# Patient Record
Sex: Female | Born: 1980 | Race: White | Hispanic: No | Smoking: Never smoker
Health system: Southern US, Community
[De-identification: ages and names within clinical notes are randomized; demographics above are authoritative.]

## PROBLEM LIST (undated history)

## (undated) DIAGNOSIS — D649 Anemia, unspecified: Secondary | ICD-10-CM

## (undated) HISTORY — DX: Anemia, unspecified: D64.9

---

## 2014-03-26 ENCOUNTER — Ambulatory Visit (INDEPENDENT_AMBULATORY_CARE_PROVIDER_SITE_OTHER): Payer: BLUE CROSS/BLUE SHIELD | Admitting: Sports Medicine

## 2014-03-26 ENCOUNTER — Encounter: Payer: Self-pay | Admitting: Sports Medicine

## 2014-03-26 VITALS — BP 105/59 | HR 75 | Ht 68.0 in | Wt 125.0 lb

## 2014-03-26 DIAGNOSIS — M25571 Pain in right ankle and joints of right foot: Secondary | ICD-10-CM

## 2014-03-26 MED ORDER — MELOXICAM 15 MG PO TABS: ORAL_TABLET | ORAL | Status: DC

## 2014-03-26 NOTE — Patient Instructions (Signed)
Your main diagnosis today is a Peroneal tendon strain. The following should help improve your symptoms:  1. Meloxicam (an NSAID) 15mg  daily for 7 days, and then take it as needed afterwards. This will help decrease the swelling and inflammation in the area.  2. We provided you with a compression sleeve today, wear this mostly with sports to help prevent swelling as well.  3. We have given you a referral to PT to help provide you with exercises for ankle strengthening.  4. Please follow-up in 4 weeks, at which time we will re-assess our plan and need for any imaging.

## 2014-03-26 NOTE — Progress Notes (Signed)
Subjective:     Patient ID: April Parsons, female   DOB: 12/22/1980, 34 y.o.   MRN: [id number]  HPI  [name] is a 33y.o. Female here today for a 1-2 month history of right ankle pain and swelling. She reports that the pain is worse behind her lateral malleolus and reports swelling and ecchymosis along that area. Pt reports tearing all of her lateral ankle ligaments while playing basketball at age 34, for which she was placed in a cast for several months. Since that injury, pt reports mild discomfort and instability in her right ankle, and thus was concerned with the increased pain and swelling she began to experience 2 months ago. Pt denies any injuries or excessive use that could have triggered this pain, but has taken[employer] for the past year, and recalls that pain began after she took a 2 week break from the class. She reports increased pain and swelling 3 days ago, after running in the yard with her niece. She also reports that unlike before, she has swelling and discomfort in the morning and after walking for long periods of time. Pt reports trying ibuprofen with the pain occasionally, which provides some relief. Patient reports that all of her swelling and pain are along the posterior aspect of the lateral malleolus along the course of the peroneal tendon with some pain extending down into the foot as well.  Review of Systems     Objective:   Physical Exam Filed Vitals:   03/26/14 0856  BP: 105/59  Pulse: 75  General: Pleasant female in NAD  Right Ankle: Observation: No edema, no erythema or bruising, symmetric in size with left ankle                     Palpation: No tenderness to palpation over lateral or medial malleolus, no soft tissue swelling, no effusion, no defects                     ROM: normal plantar flexion and dorsiflexion, normal pronation, normal supination. Patient has reproducible pain with stretching of the peroneal tendon but good strength.         Strength: Normal 5/5 strength with plantar flexion and dorsiflexion, pronation and supination                     Special testing: Anterior drawer test: positive on the right                                               Talar tilt test: increased laxity on R>L                   Assessment:     [name] is a 33y.o. Female here today for a 1-2 month history of right ankle pain and swelling most consistent with Peroneal tendon strain.    Plan:     1. Meloxicam 15mg  daily for 7 days, and then take it as needed afterwards. This will help decrease the swelling and inflammation in the area.  2. We provided you with a compression sleeve today, wear this mostly with sports to help prevent swelling as well.  3. We have given you a referral to PT to help provide you with home exercises for ankle strengthening.  4. Please follow-up in 4  weeks. If symptoms persist consider ultrasound evaluation to confirm diagnosis and rule out peroneal tendon tear.

## 2014-04-23 ENCOUNTER — Ambulatory Visit: Payer: BLUE CROSS/BLUE SHIELD | Admitting: Sports Medicine

## 2015-03-08 ENCOUNTER — Ambulatory Visit (INDEPENDENT_AMBULATORY_CARE_PROVIDER_SITE_OTHER): Payer: BLUE CROSS/BLUE SHIELD | Admitting: Physician Assistant

## 2015-03-08 VITALS — BP 104/60 | HR 94 | Temp 99.0°F | Resp 16 | Ht 67.0 in | Wt 132.0 lb

## 2015-03-08 DIAGNOSIS — J111 Influenza due to unidentified influenza virus with other respiratory manifestations: Secondary | ICD-10-CM

## 2015-03-08 DIAGNOSIS — R69 Illness, unspecified: Principal | ICD-10-CM

## 2015-03-08 DIAGNOSIS — R070 Pain in throat: Secondary | ICD-10-CM | POA: Diagnosis not present

## 2015-03-08 DIAGNOSIS — R05 Cough: Secondary | ICD-10-CM

## 2015-03-08 DIAGNOSIS — R5383 Other fatigue: Secondary | ICD-10-CM

## 2015-03-08 LAB — POCT INFLUENZA A/B
INFLUENZA A, POC: NEGATIVE
Influenza B, POC: NEGATIVE

## 2015-03-08 MED ORDER — HYDROCODONE-HOMATROPINE 5-1.5 MG/5ML PO SYRP: 2.5000 mL | ORAL_SOLUTION | Freq: Every evening | ORAL | Status: DC | PRN

## 2015-03-08 MED ORDER — OSELTAMIVIR PHOSPHATE 75 MG PO CAPS: 75.0000 mg | ORAL_CAPSULE | Freq: Two times a day (BID) | ORAL | Status: DC

## 2015-03-08 NOTE — Progress Notes (Signed)
   03/08/2015 9:43 AM   DOB: 09-13-1980 / MRN: [id number]  SUBJECTIVE:  April Parsons is a 35 y.o. female presenting for for the evaluation of fever, sore throat, headache and myagia that started 1 days ago.  She denies difficulty breathing and jaw pain. Treatments tried thus far include Nyquil with fair  relief. She reports sick contacts.  She is allergic to yeast-related products.   She  has a past medical history of Anemia.    She  reports that she has never smoked. She does not have any smokeless tobacco history on file. She  has no sexual activity history on file. The patient  has no past surgical history on file.  Her family history is not on file.  Review of Systems  Constitutional: Positive for malaise/fatigue. Negative for fever, chills and diaphoresis.  HENT: Positive for congestion and sore throat.   Respiratory: Positive for cough. Negative for hemoptysis, shortness of breath and wheezing.   Cardiovascular: Negative for chest pain.  Gastrointestinal: Negative for nausea.  Musculoskeletal: Positive for myalgias.  Skin: Negative for rash.  Neurological: Negative for dizziness and weakness.  Endo/Heme/Allergies: Negative for polydipsia.    Problem list and medications reviewed and updated by myself where necessary, and exist elsewhere in the encounter.   OBJECTIVE:  BP 104/60 mmHg  Pulse 94  Temp(Src) 99 F (37.2 C) (Oral)  Resp 16  Ht  (1.702 m)  Wt 132 lb (59.875 kg)  BMI 20.67 kg/m2  SpO2 98%  LMP 02/05/2015  Physical Exam  Constitutional: She is oriented to person, place, and time. She appears well-nourished. No distress.  Eyes: EOM are normal. Pupils are equal, round, and reactive to light.  Cardiovascular: Normal rate.   Pulmonary/Chest: Effort normal.  Abdominal: She exhibits no distension.  Neurological: She is alert and oriented to person, place, and time. No cranial nerve deficit. Gait normal.  Skin: Skin is dry. She is not diaphoretic.    Psychiatric: She has a normal mood and affect.  Vitals reviewed.   No results found for this or any previous visit (from the past 48 hour(s)).  ASSESSMENT AND PLAN:  Darthula was seen today for generalized body aches, sinusitis and sore throat.  Diagnoses and all orders for this visit:  Influenza-like illness: Flu test negative however the way she is describing her symptoms point towards influenza.  Will treat.  Work note provided.  -     POCT Influenza A/B -     oseltamivir (TAMIFLU) 75 MG capsule; Take 1 capsule (75 mg total) by mouth 2 (two) times daily. -     HYDROcodone-homatropine (HYCODAN) 5-1.5 MG/5ML syrup; Take 2.5-5 mLs by mouth at bedtime as needed.    The patient was advised to call or return to clinic if she does not see an improvement in symptoms or to seek the care of the closest emergency department if she worsens with the above plan.   Deliah Boston, MHS, PA-C [facility] 03/08/2015 9:43 AM

## 2015-03-08 NOTE — Patient Instructions (Signed)
 -   You would benefit from high dose ibuprofen. TAKE 600-800 mg of ibuprofen every 8 hours as needed to control low grade fever, fatigue, and pain.    - I also recommend that you take Zyrtec-D 5-120 every morning or Claritin-D 10-240 for the next five to 10 days. This medication will help you with nasal congestion, post nasal drip and sneezing. You will have to purchase this directly from the pharmacist

## 2015-05-15 ENCOUNTER — Ambulatory Visit (INDEPENDENT_AMBULATORY_CARE_PROVIDER_SITE_OTHER): Payer: BLUE CROSS/BLUE SHIELD | Admitting: Physician Assistant

## 2015-05-15 VITALS — BP 102/62 | HR 81 | Temp 98.3°F | Resp 18 | Ht 67.0 in | Wt 132.0 lb

## 2015-05-15 DIAGNOSIS — R21 Rash and other nonspecific skin eruption: Secondary | ICD-10-CM | POA: Diagnosis not present

## 2015-05-15 DIAGNOSIS — L282 Other prurigo: Secondary | ICD-10-CM | POA: Diagnosis not present

## 2015-05-15 MED ORDER — BETAMETHASONE DIPROPIONATE 0.05 % EX CREA: TOPICAL_CREAM | Freq: Two times a day (BID) | CUTANEOUS | Status: DC

## 2015-05-15 MED ORDER — PREDNISONE 20 MG PO TABS: 40.0000 mg | ORAL_TABLET | Freq: Every day | ORAL | Status: DC

## 2015-05-15 NOTE — Patient Instructions (Signed)
     IF you received an x-ray today, you will receive an invoice from [facility]. Please contact [facility] at [phone] with questions or concerns regarding your invoice.   IF you received labwork today, you will receive an invoice from [facility]. Please contact [facility] at [phone] with questions or concerns regarding your invoice.   Our billing staff will not be able to assist you with questions regarding bills from these companies.  You will be contacted with the lab results as soon as they are available. The fastest way to get your results is to activate your My Chart account. Instructions are located on the last page of this paperwork. If you have not heard from us regarding the results in 2 weeks, please contact this office.      

## 2015-05-15 NOTE — Progress Notes (Signed)
   05/15/2015 2:49 PM   DOB: 03-01-1980 / MRN: [id number]  SUBJECTIVE:  April Parsons is a 35 y.o. female presenting for a rash on the back of her neck that began after she rubbed copious muscle rub to the area three days ago.  She is complaining of primarily itching at this time. Reports the itching is somewhat improved but has been largely intractable to po antihistamines, topical steroid OTC preps, and benadryl cream.    She is allergic to yeast-related products.   She  has a past medical history of Anemia.    She  reports that she has never smoked. She does not have any smokeless tobacco history on file. She  has no sexual activity history on file. The patient  has no past surgical history on file.  Her family history is not on file.  Review of Systems  Constitutional: Negative for fever and chills.  Cardiovascular: Negative for chest pain.  Skin: Positive for itching and rash.  Neurological: Negative for dizziness and headaches.    Problem list and medications reviewed and updated by myself where necessary, and exist elsewhere in the encounter.   OBJECTIVE:  BP 102/62 mmHg  Pulse 81  Temp(Src) 98.3 F (36.8 C) (Oral)  Resp 18  Ht 5\' 7"  (1.702 m)  Wt 132 lb (59.875 kg)  BMI 20.67 kg/m2  SpO2 99%  LMP 05/13/2015  Physical Exam  Constitutional: She is oriented to person, place, and time. She appears well-nourished. No distress.  Eyes: EOM are normal. Pupils are equal, round, and reactive to light.  Cardiovascular: Normal rate.   Pulmonary/Chest: Effort normal.  Abdominal: She exhibits no distension.  Musculoskeletal:       Back:  Neurological: She is alert and oriented to person, place, and time. No cranial nerve deficit. Gait normal.  Skin: Skin is dry. She is not diaphoretic.  Psychiatric: She has a normal mood and affect.  Vitals reviewed.   No results found for this or any previous visit (from the past 72 hour(s)).  No results found.  ASSESSMENT AND  PLAN  [name] was seen today for neck problem.  Diagnoses and all orders for this visit:  Rash and nonspecific skin eruption: This is probably a reaction to excess capsaicin. She wants po steroids. Will fill given that she is in agony, however I have advised that she give the betamethasone 24 hours before filling.  I have advised that she avoid any other creams at this time.  She voiced understanding of the plan and will try to avoid to po steroids.  -     betamethasone dipropionate (DIPROLENE) 0.05 % cream; Apply topically 2 (two) times daily.  Pruritic rash  Other orders -     predniSONE (DELTASONE) 20 MG tablet; Take 2 tablets (40 mg total) by mouth daily with breakfast. Please give the cream 24 hours before filling this medication.    The patient was advised to call or return to clinic if she does not see an improvement in symptoms or to seek the care of the closest emergency department if she worsens with the above plan.   Deliah BostonMichael Naimah Yingst, MHS, PA-C [facility] 05/15/2015 2:49 PM

## 2016-08-23 ENCOUNTER — Encounter: Payer: Self-pay | Admitting: Physician Assistant

## 2016-08-23 ENCOUNTER — Ambulatory Visit (INDEPENDENT_AMBULATORY_CARE_PROVIDER_SITE_OTHER): Payer: Self-pay | Admitting: Physician Assistant

## 2016-08-23 VITALS — BP 97/65 | HR 88 | Temp 97.9°F | Resp 18 | Ht 67.0 in | Wt 127.0 lb

## 2016-08-23 DIAGNOSIS — R5383 Other fatigue: Secondary | ICD-10-CM

## 2016-08-23 DIAGNOSIS — J029 Acute pharyngitis, unspecified: Secondary | ICD-10-CM

## 2016-08-23 DIAGNOSIS — R591 Generalized enlarged lymph nodes: Secondary | ICD-10-CM

## 2016-08-23 LAB — POCT RAPID STREP A (OFFICE): Rapid Strep A Screen: NEGATIVE

## 2016-08-23 MED ORDER — IBUPROFEN 600 MG PO TABS: 600.0000 mg | ORAL_TABLET | Freq: Three times a day (TID) | ORAL | 0 refills | Status: DC | PRN

## 2016-08-23 MED ORDER — LIDOCAINE VISCOUS 2 % MT SOLN: 5.0000 mL | OROMUCOSAL | 1 refills | Status: DC | PRN

## 2016-08-23 MED ORDER — AMOXICILLIN 875 MG PO TABS: 875.0000 mg | ORAL_TABLET | Freq: Two times a day (BID) | ORAL | 0 refills | Status: AC

## 2016-08-23 NOTE — Progress Notes (Signed)
08/23/2016 6:16 PM   DOB: 1980/04/06 / MRN: [id number]  SUBJECTIVE:  April Parsons is a 36 y.o. female presenting for sore throat and fatigue. States that the sore throat has been on and off for about 20 or so days now. Associates fatigue.  Denies sneezing and sinus drainage, eye and ear itching. Denies GERD.  Feels that she is getting worse.   Has been taking cough drops for throat pain. She is self pay.   She is allergic to yeast-related products.   She  has a past medical history of Anemia.    She  reports that she has never smoked. She has never used smokeless tobacco. She  has no sexual activity history on file. The patient  has no past surgical history on file.  Her family history is not on file.  Review of Systems  Constitutional: Negative for chills and fever.  Skin: Negative for itching and rash.    The problem list and medications were reviewed and updated by myself where necessary and exist elsewhere in the encounter.   OBJECTIVE:  BP 97/65 (BP Location: Right Arm, Patient Position: Sitting, Cuff Size: Normal)   Pulse 88   Temp 97.9 F (36.6 C) (Oral)   Resp 18   Ht 5\' 7"  (1.702 m)   Wt 127 lb (57.6 kg)   LMP 07/18/2016   SpO2 97%   BMI 19.89 kg/m   BP Readings from Last 3 Encounters:  08/23/16 97/65  05/15/15 102/62  03/08/15 104/60   Physical Exam  Constitutional: She is active.  Non-toxic appearance.  HENT:  Right Ear: Hearing, tympanic membrane, external ear and ear canal normal.  Left Ear: Hearing, tympanic membrane, external ear and ear canal normal.  Nose: Nose normal. Right sinus exhibits no maxillary sinus tenderness and no frontal sinus tenderness. Left sinus exhibits no maxillary sinus tenderness and no frontal sinus tenderness.  Mouth/Throat: Uvula is midline, oropharynx is clear and moist and mucous membranes are normal. Mucous membranes are not dry. No oropharyngeal exudate, posterior oropharyngeal edema or tonsillar abscesses.    Cardiovascular: Normal rate, regular rhythm, S1 normal, S2 normal, normal heart sounds and intact distal pulses.  Exam reveals no gallop, no friction rub and no decreased pulses.   No murmur heard. Pulmonary/Chest: Effort normal. No stridor. No tachypnea. No respiratory distress. She has no wheezes. She has no rales.  Abdominal: Soft. Bowel sounds are normal. She exhibits no distension and no mass. There is no tenderness. There is no rebound and no guarding.  Musculoskeletal: She exhibits no edema.  Lymphadenopathy:       Head (right side): Submandibular adenopathy present. No tonsillar adenopathy present.       Head (left side): Submandibular adenopathy present. No tonsillar adenopathy present.    She has no cervical adenopathy.  Neurological: She is alert.  Skin: Skin is warm and dry. She is not diaphoretic. No pallor.    Results for orders placed or performed in visit on 08/23/16 (from the past 72 hour(s))  POCT rapid strep A     Status: None   Collection Time: 08/23/16 11:57 AM  Result Value Ref Range   Rapid Strep A Screen Negative Negative    No results found.  ASSESSMENT AND PLAN:  April Parsons was seen today for sore throat.  Diagnoses and all orders for this visit:  Sore throat: No sign of allergies on exam.  No reports of GERD. Holding culture as she is self pay.  Mono unlikely given her  age but will check this. Symptomatic relief and amox for now.  -     Cancel: Culture, Group A Strep -     POCT rapid strep A -     ibuprofen (ADVIL,MOTRIN) 600 MG tablet; Take 1 tablet (600 mg total) by mouth every 8 (eight) hours as needed. -     lidocaine (XYLOCAINE) 2 % solution; Use as directed 5-10 mLs in the mouth or throat as needed for mouth pain.  Fatigue, unspecified type -     CBC with Differential/Platelet -     Epstein-Barr virus VCA, IgM -     TSH  Lymphadenopathy of head and neck -     amoxicillin (AMOXIL) 875 MG tablet; Take 1 tablet (875 mg total) by mouth 2 (two) times  daily.    The patient is advised to call or return to clinic if she does not see an improvement in symptoms, or to seek the care of the closest emergency department if she worsens with the above plan.   Deliah Boston, MHS, PA-C [facility] 08/23/2016 6:16 PM

## 2016-08-23 NOTE — Patient Instructions (Signed)
     IF you received an x-ray today, you will receive an invoice from [facility]. Please contact [facility] at [phone] with questions or concerns regarding your invoice.   IF you received labwork today, you will receive an invoice from [facility]. Please contact [facility] at [phone] with questions or concerns regarding your invoice.   Our billing staff will not be able to assist you with questions regarding bills from these companies.  You will be contacted with the lab results as soon as they are available. The fastest way to get your results is to activate your My Chart account. Instructions are located on the last page of this paperwork. If you have not heard from us regarding the results in 2 weeks, please contact this office.     

## 2016-08-24 LAB — CBC WITH DIFFERENTIAL/PLATELET
BASOS ABS: 0 10*3/uL (ref 0.0–0.2)
Basos: 0 %
EOS (ABSOLUTE): 0.2 10*3/uL (ref 0.0–0.4)
Eos: 2 %
HEMATOCRIT: 37.5 % (ref 34.0–46.6)
Hemoglobin: 12.6 g/dL (ref 11.1–15.9)
Immature Grans (Abs): 0 10*3/uL (ref 0.0–0.1)
Immature Granulocytes: 0 %
LYMPHS ABS: 1.7 10*3/uL (ref 0.7–3.1)
Lymphs: 25 %
MCH: 31.5 pg (ref 26.6–33.0)
MCHC: 33.6 g/dL (ref 31.5–35.7)
MCV: 94 fL (ref 79–97)
MONOCYTES: 8 %
MONOS ABS: 0.5 10*3/uL (ref 0.1–0.9)
Neutrophils Absolute: 4.3 10*3/uL (ref 1.4–7.0)
Neutrophils: 65 %
Platelets: 253 10*3/uL (ref 150–379)
RBC: 4 x10E6/uL (ref 3.77–5.28)
RDW: 12.9 % (ref 12.3–15.4)
WBC: 6.7 10*3/uL (ref 3.4–10.8)

## 2016-08-24 LAB — TSH: TSH: 1.09 u[IU]/mL (ref 0.450–4.500)

## 2016-08-24 LAB — EPSTEIN-BARR VIRUS VCA, IGM

## 2016-08-25 LAB — CULTURE, GROUP A STREP: Strep A Culture: NEGATIVE

## 2016-09-01 ENCOUNTER — Telehealth: Payer: Self-pay

## 2016-09-01 NOTE — Telephone Encounter (Signed)
Call placed to patient as requested, no answer on patient phone. Left message for patient to return call to this office./ S.Annemarie Sebree,CMA 

## 2016-09-01 NOTE — Telephone Encounter (Signed)
-----   Message from Ofilia NeasMichael L Clark, PA-C sent at 08/30/2016  2:54 PM EDT ----- Molli Knockkay to stop abx as the work up is completely negative. If remains symptomatic this trying a Zyrtec in the morning and a ranitidine 150 mg at night for a week would be advisable before coming back in. Deliah BostonMichael Clark, MS, PA-C 2:54 PM, 08/30/2016

## 2017-02-23 ENCOUNTER — Encounter: Payer: Self-pay | Admitting: Physician Assistant

## 2017-02-23 ENCOUNTER — Ambulatory Visit: Payer: 59 | Admitting: Physician Assistant

## 2017-02-23 ENCOUNTER — Other Ambulatory Visit: Payer: Self-pay

## 2017-02-23 VITALS — BP 99/64 | HR 81 | Temp 98.4°F | Resp 18 | Ht 67.44 in | Wt 128.0 lb

## 2017-02-23 DIAGNOSIS — R1084 Generalized abdominal pain: Secondary | ICD-10-CM | POA: Diagnosis not present

## 2017-02-23 DIAGNOSIS — N926 Irregular menstruation, unspecified: Secondary | ICD-10-CM

## 2017-02-23 LAB — POCT URINALYSIS DIP (MANUAL ENTRY)
BILIRUBIN UA: NEGATIVE
GLUCOSE UA: NEGATIVE mg/dL
Ketones, POC UA: NEGATIVE mg/dL
Leukocytes, UA: NEGATIVE
NITRITE UA: NEGATIVE
SPEC GRAV UA: 1.015 (ref 1.010–1.025)
Urobilinogen, UA: 1 E.U./dL
pH, UA: 8.5 — AB (ref 5.0–8.0)

## 2017-02-23 LAB — POC MICROSCOPIC URINALYSIS (UMFC): Mucus: ABSENT

## 2017-02-23 LAB — POCT URINE PREGNANCY: PREG TEST UR: NEGATIVE

## 2017-02-23 MED ORDER — HYOSCYAMINE SULFATE SL 0.125 MG SL SUBL: 1.0000 | SUBLINGUAL_TABLET | Freq: Four times a day (QID) | SUBLINGUAL | 0 refills | Status: DC

## 2017-02-23 MED ORDER — ONDANSETRON 8 MG PO TBDP: 8.0000 mg | ORAL_TABLET | Freq: Three times a day (TID) | ORAL | 0 refills | Status: DC | PRN

## 2017-02-23 NOTE — Patient Instructions (Addendum)
Please hydrate well with 64 oz of water if not more. You can take ibuprofen or tylenol for pain and fever. Push hydration.  You can use ORS in water to replenish electrolytes.   Please take the anti-emetic and the levsin is for abdominal cramping.  Viral Gastroenteritis, Adult Viral gastroenteritis is also known as the stomach flu. This condition is caused by certain germs (viruses). These germs can be passed from person to person very easily (are very contagious). This condition can cause sudden watery poop (diarrhea), fever, and throwing up (vomiting). Having watery poop and throwing up can make you feel weak and cause you to get dehydrated. Dehydration can make you tired and thirsty, make you have a dry mouth, and make it so you pee (urinate) less often. Older adults and people with other diseases or a weak defense system (immune system) are at higher risk for dehydration. It is important to replace the fluids that you lose from having watery poop and throwing up. Follow these instructions at home: Follow instructions from your doctor about how to care for yourself at home. Eating and drinking  Follow these instructions as told by your doctor:  Take an oral rehydration solution (ORS). This is a drink that is sold at pharmacies and stores.  Drink clear fluids in small amounts as you are able, such as: ? Water. ? Ice chips. ? Diluted fruit juice. ? Low-calorie sports drinks.  Eat bland, easy-to-digest foods in small amounts as you are able, such as: ? Bananas. ? Applesauce. ? Rice. ? Low-fat (lean) meats. ? Toast. ? Crackers.  Avoid fluids that have a lot of sugar or caffeine in them.  Avoid alcohol.  Avoid spicy or fatty foods.  General instructions  Drink enough fluid to keep your pee (urine) clear or pale yellow.  Wash your hands often. If you cannot use soap and water, use hand sanitizer.  Make sure that all people in your home wash their hands well and often.  Rest at  home while you get better.  Take over-the-counter and prescription medicines only as told by your doctor.  Watch your condition for any changes.  Take a warm bath to help with any burning or pain from having watery poop.  Keep all follow-up visits as told by your doctor. This is important. Contact a doctor if:  You cannot keep fluids down.  Your symptoms get worse.  You have new symptoms.  You feel light-headed or dizzy.  You have muscle cramps. Get help right away if:  You have chest pain.  You feel very weak or you pass out (faint).  You see blood in your throw-up.  Your throw-up looks like coffee grounds.  You have bloody or black poop (stools) or poop that look like tar.  You have a very bad headache, a stiff neck, or both.  You have a rash.  You have very bad pain, cramping, or bloating in your belly (abdomen).  You have trouble breathing.  You are breathing very quickly.  Your heart is beating very quickly.  Your skin feels cold and clammy.  You feel confused.  You have pain when you pee.  You have signs of dehydration, such as: ? Dark pee, hardly any pee, or no pee. ? Cracked lips. ? Dry mouth. ? Sunken eyes. ? Sleepiness. ? Weakness. This information is not intended to replace advice given to you by your health care provider. Make sure you discuss any questions you have with your health care  provider. Document Released: 06/23/2007 Document Revised: 07/25/2015 Document Reviewed: 09/10/2014 Elsevier Interactive Patient Education  2017 [employer].  Food Choices to Help Relieve Diarrhea, Adult When you have diarrhea, the foods you eat and your eating habits are very important. Choosing the right foods and drinks can help:  Relieve diarrhea.  Replace lost fluids and nutrients.  Prevent dehydration.  What general guidelines should I follow? Relieving diarrhea  Choose foods with less than 2 g or .07 oz. of fiber per serving.  Limit fats  to less than 8 tsp (38 g or 1.34 oz.) a day.  Avoid the following: ? Foods and beverages sweetened with high-fructose corn syrup, honey, or sugar alcohols such as xylitol, sorbitol, and mannitol. ? Foods that contain a lot of fat or sugar. ? Fried, greasy, or spicy foods. ? High-fiber grains, breads, and cereals. ? Raw fruits and vegetables.  Eat foods that are rich in probiotics. These foods include dairy products such as yogurt and fermented milk products. They help increase healthy bacteria in the stomach and intestines (gastrointestinal tract, or GI tract).  If you have lactose intolerance, avoid dairy products. These may make your diarrhea worse.  Take medicine to help stop diarrhea (antidiarrheal medicine) only as told by your health care provider. Replacing nutrients  Eat small meals or snacks every 3-4 hours.  Eat bland foods, such as white rice, toast, or baked potato, until your diarrhea starts to get better. Gradually reintroduce nutrient-rich foods as tolerated or as told by your health care provider. This includes: ? Well-cooked protein foods. ? Peeled, seeded, and soft-cooked fruits and vegetables. ? Low-fat dairy products.  Take vitamin and mineral supplements as told by your health care provider. Preventing dehydration   Start by sipping water or a special solution to prevent dehydration (oral rehydration solution, ORS). Urine that is clear or pale yellow means that you are getting enough fluid.  Try to drink at least 8-10 cups of fluid each day to help replace lost fluids.  You may add other liquids in addition to water, such as clear juice or decaffeinated sports drinks, as tolerated or as told by your health care provider.  Avoid drinks with caffeine, such as coffee, tea, or soft drinks.  Avoid alcohol. What foods are recommended? The items listed may not be a complete list. Talk with your health care provider about what dietary choices are best for  you. Grains White rice. White, [place], or pita breads (fresh or toasted), including plain rolls, buns, or bagels. White pasta. Saltine, soda, or graham crackers. Pretzels. Low-fiber cereal. Cooked cereals made with water (such as cornmeal, farina, or cream cereals). Plain muffins. Matzo. Melba toast. Zwieback. Vegetables Potatoes (without the skin). Most well-cooked and canned vegetables without skins or seeds. Tender lettuce. Fruits Apple sauce. Fruits canned in juice. Cooked apricots, cherries, grapefruit, peaches, pears, or plums. Fresh bananas and cantaloupe. Meats and other protein foods Baked or boiled chicken. Eggs. Tofu. Fish. Seafood. Smooth nut butters. Ground or well-cooked tender beef, ham, veal, lamb, pork, or poultry. Dairy Plain yogurt, kefir, and unsweetened liquid yogurt. Lactose-free milk, buttermilk, skim milk, or soy milk. Low-fat or nonfat hard cheese. Beverages Water. Low-calorie sports drinks. Fruit juices without pulp. Strained tomato and vegetable juices. Decaffeinated teas. Sugar-free beverages not sweetened with sugar alcohols. Oral rehydration solutions, if approved by your health care provider. Seasoning and other foods Bouillon, broth, or soups made from recommended foods. What foods are not recommended? The items listed may not be a complete  list. Talk with your health care provider about what dietary choices are best for you. Grains Whole grain, whole wheat, bran, or rye breads, rolls, pastas, and crackers. Wild or brown rice. Whole grain or bran cereals. Barley. Oats and oatmeal. Corn tortillas or taco shells. Granola. Popcorn. Vegetables Raw vegetables. Fried vegetables. Cabbage, broccoli, Brussels sprouts, artichokes, baked beans, beet greens, corn, kale, legumes, peas, sweet potatoes, and yams. Potato skins. Cooked spinach and cabbage. Fruits Dried fruit, including raisins and dates. Raw fruits. Stewed or dried prunes. Canned fruits with syrup. Meat and  other protein foods Fried or fatty meats. Deli meats. Chunky nut butters. Nuts and seeds. Beans and lentils. Tomasa Blase. Hot dogs. Sausage. Dairy High-fat cheeses. Whole milk, chocolate milk, and beverages made with milk, such as milk shakes. Half-and-half. Cream. sour cream. Ice cream. Beverages Caffeinated beverages (such as coffee, tea, soda, or energy drinks). Alcoholic beverages. Fruit juices with pulp. Prune juice. Soft drinks sweetened with high-fructose corn syrup or sugar alcohols. High-calorie sports drinks. Fats and oils Butter. Cream sauces. Margarine. Salad oils. Plain salad dressings. Olives. Avocados. Mayonnaise. Sweets and desserts Sweet rolls, doughnuts, and sweet breads. Sugar-free desserts sweetened with sugar alcohols such as xylitol and sorbitol. Seasoning and other foods Honey. Hot sauce. Chili powder. Gravy. Cream-based or milk-based soups. Pancakes and waffles. Summary  When you have diarrhea, the foods you eat and your eating habits are very important.  Make sure you get at least 8-10 cups of fluid each day, or enough to keep your urine clear or pale yellow.  Eat bland foods and gradually reintroduce healthy, nutrient-rich foods as tolerated, or as told by your health care provider.  Avoid high-fiber, fried, greasy, or spicy foods. This information is not intended to replace advice given to you by your health care provider. Make sure you discuss any questions you have with your health care provider. Document Released: 03/27/2003 Document Revised: 01/02/2016 Document Reviewed: 01/02/2016 Elsevier Interactive Patient Education  2018 [employer].    IF you received an x-ray today, you will receive an invoice from [facility]. Please contact [facility] at [phone] with questions or concerns regarding your invoice.   IF you received labwork today, you will receive an invoice from [facility]. Please contact [facility] at [phone] with questions or  concerns regarding your invoice.   Our billing staff will not be able to assist you with questions regarding bills from these companies.  You will be contacted with the lab results as soon as they are available. The fastest way to get your results is to activate your My Chart account. Instructions are located on the last page of this paperwork. If you have not heard from [place] regarding the results in 2 weeks, please contact this office.

## 2017-02-23 NOTE — Progress Notes (Signed)
[facility] [address], [place] [phone]  Date:  02/23/2017   Name:  April Parsons   DOB:  12-22-1980   MRN:  [id number]  PCP:  Patient, No Pcp Per    History of Present Illness:  April Parsons is a 37 y.o. female patient who presents to PCP with  Chief Complaint  Patient presents with  . Dizziness    X 2 days- pt states with headache  . Emesis    X 3 days with some diarrhea and hot and cold- pt thinks food poisoning      4 days ago onset of emesis and diarrhea.  The next day, she had diarrhea and emesis.  No fever or chills.  She has headache, and fatigue.  She had 1 bottle of pedialyte.  She has some nausea.  Last episode of emesis was 2 days ago.  Last episode 2 days ago.  Stool was black, but had some pepto bismol.   No coughing congestion, sore throat and bodyaches.   No foreign trouble.   April Parsons and patient both have similar symptoms with similar timeline of symptoms She notes that her episodes have decreased.   She notes that they had a salmon from [employer], but do not believe this was undercooked.    There are no active problems to display for this patient.   Past Medical History:  Diagnosis Date  . Anemia     History reviewed. No pertinent surgical history.  Social History   Tobacco Use  . Smoking status: Never Smoker  . Smokeless tobacco: Never Used  Substance Use Topics  . Alcohol use: No    Alcohol/week: 0.0 oz    Frequency: Never  . Drug use: No    Family History  Problem Relation Age of Onset  . Cancer Father     Allergies  Allergen Reactions  . Yeast-Related Products Other (See Comments)    Stomach pain    Medication list has been reviewed and updated.  Current Outpatient Medications on File Prior to Visit  Medication Sig Dispense Refill  . betamethasone dipropionate (DIPROLENE) 0.05 % cream Apply topically 2 (two) times daily. (Patient not taking: Reported on 08/23/2016) 30 g 0  .  HYDROcodone-homatropine (HYCODAN) 5-1.5 MG/5ML syrup Take 2.5-5 mLs by mouth at bedtime as needed. (Patient not taking: Reported on 05/15/2015) 60 mL 0  . ibuprofen (ADVIL,MOTRIN) 600 MG tablet Take 1 tablet (600 mg total) by mouth every 8 (eight) hours as needed. (Patient not taking: Reported on 02/23/2017) 60 tablet 0  . lidocaine (XYLOCAINE) 2 % solution Use as directed 5-10 mLs in the mouth or throat as needed for mouth pain. (Patient not taking: Reported on 02/23/2017) 100 mL 1  . meloxicam (MOBIC) 15 MG tablet Take one tablet daily for 7 days, then as needed.  Take with food (Patient not taking: Reported on 03/08/2015) 40 tablet 0  . predniSONE (DELTASONE) 20 MG tablet Take 2 tablets (40 mg total) by mouth daily with breakfast. Please give the cream 24 hours before filling this medication. (Patient not taking: Reported on 08/23/2016) 10 tablet 0  . Probiotic Product (VSL#3) CAPS Reported on 05/15/2015    . RABEprazole (ACIPHEX) 20 MG tablet Take 20 mg by mouth.    . sertraline (ZOLOFT) 25 MG tablet     . thyroid (ARMOUR) 30 MG tablet Take 20 mg by mouth.     No current facility-administered medications on file prior to visit.  ROS ROS otherwise unremarkable unless listed above.  Physical Examination: BP 99/64 (BP Location: Right Arm, Patient Position: Sitting, Cuff Size: Normal)   Pulse 81   Temp 98.4 F (36.9 C) (Oral)   Resp 18   Ht 5' 7.44" (1.713 m)   Wt 128 lb (58.1 kg)   SpO2 99%   BMI 19.79 kg/m  Ideal Body Weight: Weight in (lb) to have BMI = 25: 161.4 Orthostatic VS for the past 24 hrs (Last 3 readings):  BP- Lying Pulse- Lying BP- Sitting Pulse- Sitting BP- Standing at 3 minutes Pulse- Standing at 3 minutes  02/23/17 1634 94/60 72 96/64 77 94/66 93    Physical Exam  Constitutional: She is oriented to person, place, and time. She appears well-developed and well-nourished. No distress.  HENT:  Head: Normocephalic and atraumatic.  Right Ear: External ear normal.  Left  Ear: External ear normal.  Eyes: Conjunctivae and EOM are normal. Pupils are equal, round, and reactive to light.  Cardiovascular: Normal rate.  Pulmonary/Chest: Effort normal. No respiratory distress.  Abdominal: Soft. Normal appearance and bowel sounds are normal. There is generalized tenderness.  Neurological: She is alert and oriented to person, place, and time.  Skin: She is not diaphoretic.  Psychiatric: She has a normal mood and affect. Her behavior is normal.    Results for orders placed or performed in visit on 02/23/17  POCT urinalysis dipstick  Result Value Ref Range   Color, UA yellow yellow   Clarity, UA clear clear   Glucose, UA negative negative mg/dL   Bilirubin, UA negative negative   Ketones, POC UA negative negative mg/dL   Spec Grav, UA 1.610 9.604 - 1.025   Blood, UA trace-lysed (A) negative   pH, UA 8.5 (A) 5.0 - 8.0   Protein Ur, POC trace (A) negative mg/dL   Urobilinogen, UA 1.0 0.2 or 1.0 E.U./dL   Nitrite, UA Negative Negative   Leukocytes, UA Negative Negative  POCT urine pregnancy  Result Value Ref Range   Preg Test, Ur Negative Negative    Assessment and Plan: April Parsons is a 37 y.o. female who is here today for cc of  Chief Complaint  Patient presents with  . Dizziness    X 2 days- pt states with headache  . Emesis    X 3 days with some diarrhea and hot and cold- pt thinks food poisoning   likely gastroenteritis.  Advised to hydrate well at home with anti-emetic and cramping medication given. Alarming sxs to warrant an immediate return noted.  Generalized abdominal pain - Plan: POCT urinalysis dipstick, POCT Microscopic Urinalysis ([facility])  Missed period - Plan: POCT urine pregnancy, POCT Microscopic Urinalysis ([facility])  April Platt, PA-C [facility] 2/19/20197:53 AM

## 2017-04-27 ENCOUNTER — Encounter: Payer: Self-pay | Admitting: Physician Assistant

## 2017-10-21 ENCOUNTER — Ambulatory Visit (INDEPENDENT_AMBULATORY_CARE_PROVIDER_SITE_OTHER): Payer: 59 | Admitting: Mental Health

## 2017-10-21 DIAGNOSIS — F332 Major depressive disorder, recurrent severe without psychotic features: Secondary | ICD-10-CM

## 2017-10-24 NOTE — Progress Notes (Signed)
      [facility] Counselor/Therapist Progress Note   Patient ID: April Parsons, MRN: [id number]  Date: 10/24/2017  Timespent: 60 minutes  Treatment Type: Individual  Subjective: Patient arrived on time for today's session.  Discussed progress and events since last visit.  She stated that she was terminated from her job.  Most of the session was centered around allowing patient to process details and feelings related to this life change.  Problem solving was done with patient to assist her and communicating needs to her past employer as she will be looking for another full-time job in the coming weeks.  Patient shared her relationship with her husband, how they continue to have some communication issues at times related to his obsessive thinking and frequent need for reassurance.  Patient identified needing more time for social outlets for herself.  She continues to struggle to have close friends in this area.  She identified needing to take steps for her towards self-care related to exercise and engaging in other interests on her days off.  Encouraged patient to identify and schedule these events as well as discussed behavioral steps to increase consistency.  Interventions:CBT, Solution Focused and Strength-based  Mental Status Exam:   Appearance:   Casual     Behavior:  Appropriate and Sharing  Motor:  Normal  Speech/Language:   Normal Rate  Affect:  Full Range  Mood:  depressed  Thought process:  Coherent and Relevant  Thought content:    WDL  Perceptual disturbances:    Normal  Orientation:  Full (Time, Place, and Person)  Attention:  Good  Concentration:  good  Memory:  Immediate  Fund of knowledge:   Good  Insight:    Good  Judgment:   Good  Impulse Control:  good    Reported Symptoms: Depressed mood, anxious, fatigue, isolated behavior, increased irritability  Risk Assessment: Danger to Self:  No Self-injurious Behavior: No Danger to Others: No Duty to  Warn:no Physical Aggression / Violence:No  Access to Firearms a concern: No  Gang Involvement:No   Diagnosis:   ICD-10-CM   1. 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  Patient to continue to engage in individual counseling 2-4 times a month or as needed. 2.  Patient to identify and apply CBT, coping skills learned in session to decrease depression and anxiety symptoms. 3.  Patient to contact this office, go to the local ED or call 911 if a crisis or emergency develops between visits.  Waldron Session, [facility]

## 2018-01-19 DIAGNOSIS — F3289 Other specified depressive episodes: Secondary | ICD-10-CM | POA: Diagnosis not present

## 2018-01-19 DIAGNOSIS — F411 Generalized anxiety disorder: Secondary | ICD-10-CM | POA: Diagnosis not present

## 2018-01-26 DIAGNOSIS — R293 Abnormal posture: Secondary | ICD-10-CM | POA: Diagnosis not present

## 2018-01-26 DIAGNOSIS — G44201 Tension-type headache, unspecified, intractable: Secondary | ICD-10-CM | POA: Diagnosis not present

## 2018-01-26 DIAGNOSIS — M40292 Other kyphosis, cervical region: Secondary | ICD-10-CM | POA: Diagnosis not present

## 2018-01-26 DIAGNOSIS — M9901 Segmental and somatic dysfunction of cervical region: Secondary | ICD-10-CM | POA: Diagnosis not present

## 2018-02-17 DIAGNOSIS — F411 Generalized anxiety disorder: Secondary | ICD-10-CM | POA: Diagnosis not present

## 2018-02-17 DIAGNOSIS — F3289 Other specified depressive episodes: Secondary | ICD-10-CM | POA: Diagnosis not present

## 2018-07-28 DIAGNOSIS — E559 Vitamin D deficiency, unspecified: Secondary | ICD-10-CM | POA: Diagnosis not present

## 2018-07-28 DIAGNOSIS — E039 Hypothyroidism, unspecified: Secondary | ICD-10-CM | POA: Diagnosis not present

## 2018-07-28 DIAGNOSIS — R5383 Other fatigue: Secondary | ICD-10-CM | POA: Diagnosis not present

## 2018-07-28 DIAGNOSIS — R5382 Chronic fatigue, unspecified: Secondary | ICD-10-CM | POA: Diagnosis not present

## 2018-07-28 DIAGNOSIS — B3782 Candidal enteritis: Secondary | ICD-10-CM | POA: Diagnosis not present

## 2018-07-28 DIAGNOSIS — R41840 Attention and concentration deficit: Secondary | ICD-10-CM | POA: Diagnosis not present

## 2018-07-28 DIAGNOSIS — F419 Anxiety disorder, unspecified: Secondary | ICD-10-CM | POA: Diagnosis not present

## 2018-08-11 DIAGNOSIS — D171 Benign lipomatous neoplasm of skin and subcutaneous tissue of trunk: Secondary | ICD-10-CM | POA: Diagnosis not present

## 2018-08-11 DIAGNOSIS — D225 Melanocytic nevi of trunk: Secondary | ICD-10-CM | POA: Diagnosis not present

## 2018-08-11 DIAGNOSIS — Q825 Congenital non-neoplastic nevus: Secondary | ICD-10-CM | POA: Diagnosis not present

## 2018-08-11 DIAGNOSIS — L821 Other seborrheic keratosis: Secondary | ICD-10-CM | POA: Diagnosis not present

## 2018-09-11 DIAGNOSIS — D171 Benign lipomatous neoplasm of skin and subcutaneous tissue of trunk: Secondary | ICD-10-CM | POA: Diagnosis not present

## 2018-12-18 DIAGNOSIS — M25551 Pain in right hip: Secondary | ICD-10-CM | POA: Diagnosis not present

## 2018-12-18 DIAGNOSIS — M546 Pain in thoracic spine: Secondary | ICD-10-CM | POA: Diagnosis not present

## 2018-12-18 DIAGNOSIS — M542 Cervicalgia: Secondary | ICD-10-CM | POA: Diagnosis not present

## 2018-12-18 DIAGNOSIS — M545 Low back pain: Secondary | ICD-10-CM | POA: Diagnosis not present

## 2018-12-21 DIAGNOSIS — M542 Cervicalgia: Secondary | ICD-10-CM | POA: Diagnosis not present

## 2018-12-21 DIAGNOSIS — M546 Pain in thoracic spine: Secondary | ICD-10-CM | POA: Diagnosis not present

## 2018-12-21 DIAGNOSIS — M25551 Pain in right hip: Secondary | ICD-10-CM | POA: Diagnosis not present

## 2018-12-21 DIAGNOSIS — M545 Low back pain: Secondary | ICD-10-CM | POA: Diagnosis not present

## 2018-12-22 DIAGNOSIS — M25551 Pain in right hip: Secondary | ICD-10-CM | POA: Diagnosis not present

## 2018-12-22 DIAGNOSIS — M545 Low back pain: Secondary | ICD-10-CM | POA: Diagnosis not present

## 2018-12-22 DIAGNOSIS — M546 Pain in thoracic spine: Secondary | ICD-10-CM | POA: Diagnosis not present

## 2018-12-22 DIAGNOSIS — M542 Cervicalgia: Secondary | ICD-10-CM | POA: Diagnosis not present

## 2018-12-30 DIAGNOSIS — Z20828 Contact with and (suspected) exposure to other viral communicable diseases: Secondary | ICD-10-CM | POA: Diagnosis not present

## 2019-05-18 DIAGNOSIS — F411 Generalized anxiety disorder: Secondary | ICD-10-CM | POA: Diagnosis not present

## 2019-05-25 DIAGNOSIS — R0782 Intercostal pain: Secondary | ICD-10-CM | POA: Diagnosis not present

## 2019-05-25 DIAGNOSIS — M9901 Segmental and somatic dysfunction of cervical region: Secondary | ICD-10-CM | POA: Diagnosis not present

## 2019-05-25 DIAGNOSIS — M9902 Segmental and somatic dysfunction of thoracic region: Secondary | ICD-10-CM | POA: Diagnosis not present

## 2019-05-25 DIAGNOSIS — L720 Epidermal cyst: Secondary | ICD-10-CM | POA: Diagnosis not present

## 2019-05-25 DIAGNOSIS — M9903 Segmental and somatic dysfunction of lumbar region: Secondary | ICD-10-CM | POA: Diagnosis not present

## 2019-05-30 DIAGNOSIS — M9901 Segmental and somatic dysfunction of cervical region: Secondary | ICD-10-CM | POA: Diagnosis not present

## 2019-05-30 DIAGNOSIS — M9902 Segmental and somatic dysfunction of thoracic region: Secondary | ICD-10-CM | POA: Diagnosis not present

## 2019-05-30 DIAGNOSIS — R0782 Intercostal pain: Secondary | ICD-10-CM | POA: Diagnosis not present

## 2019-05-30 DIAGNOSIS — M9903 Segmental and somatic dysfunction of lumbar region: Secondary | ICD-10-CM | POA: Diagnosis not present

## 2019-06-01 DIAGNOSIS — F411 Generalized anxiety disorder: Secondary | ICD-10-CM | POA: Diagnosis not present

## 2019-06-08 DIAGNOSIS — M9901 Segmental and somatic dysfunction of cervical region: Secondary | ICD-10-CM | POA: Diagnosis not present

## 2019-06-08 DIAGNOSIS — R0782 Intercostal pain: Secondary | ICD-10-CM | POA: Diagnosis not present

## 2019-06-08 DIAGNOSIS — M9903 Segmental and somatic dysfunction of lumbar region: Secondary | ICD-10-CM | POA: Diagnosis not present

## 2019-06-08 DIAGNOSIS — M9902 Segmental and somatic dysfunction of thoracic region: Secondary | ICD-10-CM | POA: Diagnosis not present

## 2019-06-15 DIAGNOSIS — M9903 Segmental and somatic dysfunction of lumbar region: Secondary | ICD-10-CM | POA: Diagnosis not present

## 2019-06-15 DIAGNOSIS — R0782 Intercostal pain: Secondary | ICD-10-CM | POA: Diagnosis not present

## 2019-06-15 DIAGNOSIS — M9902 Segmental and somatic dysfunction of thoracic region: Secondary | ICD-10-CM | POA: Diagnosis not present

## 2019-06-15 DIAGNOSIS — M9901 Segmental and somatic dysfunction of cervical region: Secondary | ICD-10-CM | POA: Diagnosis not present

## 2019-07-27 DIAGNOSIS — E559 Vitamin D deficiency, unspecified: Secondary | ICD-10-CM | POA: Diagnosis not present

## 2019-07-27 DIAGNOSIS — E039 Hypothyroidism, unspecified: Secondary | ICD-10-CM | POA: Diagnosis not present

## 2019-07-27 DIAGNOSIS — R799 Abnormal finding of blood chemistry, unspecified: Secondary | ICD-10-CM | POA: Diagnosis not present

## 2019-07-27 DIAGNOSIS — R5383 Other fatigue: Secondary | ICD-10-CM | POA: Diagnosis not present

## 2019-11-23 DIAGNOSIS — R1013 Epigastric pain: Secondary | ICD-10-CM | POA: Diagnosis not present

## 2019-11-23 DIAGNOSIS — R197 Diarrhea, unspecified: Secondary | ICD-10-CM | POA: Diagnosis not present

## 2019-11-23 DIAGNOSIS — Z13220 Encounter for screening for lipoid disorders: Secondary | ICD-10-CM | POA: Diagnosis not present

## 2019-11-23 DIAGNOSIS — E039 Hypothyroidism, unspecified: Secondary | ICD-10-CM | POA: Diagnosis not present

## 2019-11-23 DIAGNOSIS — Z0000 Encounter for general adult medical examination without abnormal findings: Secondary | ICD-10-CM | POA: Diagnosis not present

## 2019-12-03 DIAGNOSIS — R197 Diarrhea, unspecified: Secondary | ICD-10-CM | POA: Diagnosis not present

## 2019-12-03 DIAGNOSIS — R1010 Upper abdominal pain, unspecified: Secondary | ICD-10-CM | POA: Diagnosis not present

## 2020-02-26 DIAGNOSIS — Z01812 Encounter for preprocedural laboratory examination: Secondary | ICD-10-CM | POA: Diagnosis not present

## 2020-04-08 DIAGNOSIS — Z01812 Encounter for preprocedural laboratory examination: Secondary | ICD-10-CM | POA: Diagnosis not present

## 2020-04-11 DIAGNOSIS — K2930 Chronic superficial gastritis without bleeding: Secondary | ICD-10-CM | POA: Diagnosis not present

## 2020-04-11 DIAGNOSIS — R1013 Epigastric pain: Secondary | ICD-10-CM | POA: Diagnosis not present

## 2020-04-11 DIAGNOSIS — R197 Diarrhea, unspecified: Secondary | ICD-10-CM | POA: Diagnosis not present

## 2020-04-11 DIAGNOSIS — K3189 Other diseases of stomach and duodenum: Secondary | ICD-10-CM | POA: Diagnosis not present

## 2020-04-11 DIAGNOSIS — K259 Gastric ulcer, unspecified as acute or chronic, without hemorrhage or perforation: Secondary | ICD-10-CM | POA: Diagnosis not present

## 2020-04-18 DIAGNOSIS — R0782 Intercostal pain: Secondary | ICD-10-CM | POA: Diagnosis not present

## 2020-04-18 DIAGNOSIS — M9902 Segmental and somatic dysfunction of thoracic region: Secondary | ICD-10-CM | POA: Diagnosis not present

## 2020-04-18 DIAGNOSIS — M9901 Segmental and somatic dysfunction of cervical region: Secondary | ICD-10-CM | POA: Diagnosis not present

## 2020-04-18 DIAGNOSIS — M25476 Effusion, unspecified foot: Secondary | ICD-10-CM | POA: Diagnosis not present

## 2020-04-18 DIAGNOSIS — L568 Other specified acute skin changes due to ultraviolet radiation: Secondary | ICD-10-CM | POA: Diagnosis not present

## 2020-04-18 DIAGNOSIS — M9903 Segmental and somatic dysfunction of lumbar region: Secondary | ICD-10-CM | POA: Diagnosis not present

## 2020-04-25 DIAGNOSIS — R0782 Intercostal pain: Secondary | ICD-10-CM | POA: Diagnosis not present

## 2020-04-25 DIAGNOSIS — M9902 Segmental and somatic dysfunction of thoracic region: Secondary | ICD-10-CM | POA: Diagnosis not present

## 2020-04-25 DIAGNOSIS — M9901 Segmental and somatic dysfunction of cervical region: Secondary | ICD-10-CM | POA: Diagnosis not present

## 2020-04-25 DIAGNOSIS — M9903 Segmental and somatic dysfunction of lumbar region: Secondary | ICD-10-CM | POA: Diagnosis not present

## 2020-04-30 DIAGNOSIS — K529 Noninfective gastroenteritis and colitis, unspecified: Secondary | ICD-10-CM | POA: Diagnosis not present

## 2020-05-09 DIAGNOSIS — F900 Attention-deficit hyperactivity disorder, predominantly inattentive type: Secondary | ICD-10-CM | POA: Diagnosis not present

## 2020-05-16 DIAGNOSIS — F900 Attention-deficit hyperactivity disorder, predominantly inattentive type: Secondary | ICD-10-CM | POA: Diagnosis not present

## 2020-05-23 DIAGNOSIS — M25551 Pain in right hip: Secondary | ICD-10-CM | POA: Diagnosis not present

## 2020-05-23 DIAGNOSIS — M79671 Pain in right foot: Secondary | ICD-10-CM | POA: Diagnosis not present

## 2020-05-23 DIAGNOSIS — R5383 Other fatigue: Secondary | ICD-10-CM | POA: Diagnosis not present

## 2020-05-23 DIAGNOSIS — M79641 Pain in right hand: Secondary | ICD-10-CM | POA: Diagnosis not present

## 2020-05-23 DIAGNOSIS — M25561 Pain in right knee: Secondary | ICD-10-CM | POA: Diagnosis not present

## 2020-05-23 DIAGNOSIS — L568 Other specified acute skin changes due to ultraviolet radiation: Secondary | ICD-10-CM | POA: Diagnosis not present

## 2020-05-23 DIAGNOSIS — M545 Low back pain, unspecified: Secondary | ICD-10-CM | POA: Diagnosis not present

## 2020-05-23 DIAGNOSIS — M2550 Pain in unspecified joint: Secondary | ICD-10-CM | POA: Diagnosis not present

## 2020-05-23 DIAGNOSIS — M533 Sacrococcygeal disorders, not elsewhere classified: Secondary | ICD-10-CM | POA: Diagnosis not present

## 2020-05-23 DIAGNOSIS — R768 Other specified abnormal immunological findings in serum: Secondary | ICD-10-CM | POA: Diagnosis not present

## 2020-05-23 DIAGNOSIS — M25562 Pain in left knee: Secondary | ICD-10-CM | POA: Diagnosis not present

## 2020-05-23 DIAGNOSIS — M79642 Pain in left hand: Secondary | ICD-10-CM | POA: Diagnosis not present

## 2020-05-23 DIAGNOSIS — M79672 Pain in left foot: Secondary | ICD-10-CM | POA: Diagnosis not present

## 2020-06-12 DIAGNOSIS — R768 Other specified abnormal immunological findings in serum: Secondary | ICD-10-CM | POA: Diagnosis not present

## 2020-06-12 DIAGNOSIS — E038 Other specified hypothyroidism: Secondary | ICD-10-CM | POA: Diagnosis not present

## 2020-06-12 DIAGNOSIS — R4189 Other symptoms and signs involving cognitive functions and awareness: Secondary | ICD-10-CM | POA: Diagnosis not present

## 2020-06-12 DIAGNOSIS — R5383 Other fatigue: Secondary | ICD-10-CM | POA: Diagnosis not present

## 2020-06-20 DIAGNOSIS — F900 Attention-deficit hyperactivity disorder, predominantly inattentive type: Secondary | ICD-10-CM | POA: Diagnosis not present

## 2020-06-20 DIAGNOSIS — K253 Acute gastric ulcer without hemorrhage or perforation: Secondary | ICD-10-CM | POA: Diagnosis not present

## 2020-07-31 ENCOUNTER — Ambulatory Visit: Payer: BC Managed Care – PPO | Admitting: Podiatry

## 2020-07-31 ENCOUNTER — Encounter: Payer: Self-pay | Admitting: Podiatry

## 2020-07-31 ENCOUNTER — Other Ambulatory Visit: Payer: Self-pay

## 2020-07-31 DIAGNOSIS — F900 Attention-deficit hyperactivity disorder, predominantly inattentive type: Secondary | ICD-10-CM | POA: Diagnosis not present

## 2020-07-31 DIAGNOSIS — L600 Ingrowing nail: Secondary | ICD-10-CM

## 2020-07-31 NOTE — Patient Instructions (Signed)

## 2020-08-01 ENCOUNTER — Telehealth: Payer: Self-pay | Admitting: *Deleted

## 2020-08-01 MED ORDER — TRAMADOL HCL 50 MG PO TABS: 50.0000 mg | ORAL_TABLET | Freq: Three times a day (TID) | ORAL | 0 refills | Status: AC | PRN

## 2020-08-01 NOTE — Telephone Encounter (Signed)
Returned call to patient and gave information that medication (Tramadol)has been sent to pharmacy on file,verbalized understanding,said thank you!

## 2020-08-01 NOTE — Telephone Encounter (Signed)
Patient is calling to request for "something to take the edge off, nothing too strong", recently has 4 nails removed. Please advise.

## 2020-08-01 NOTE — Progress Notes (Signed)
Subjective:   Patient ID: April Parsons, female   DOB: 40 y.o.   MRN: [id number]   HPI Patient presents stating she has had chronic problems for approximate 20 years with nailbeds on the left foot that she has tried medication for topicals and other techniques without relief.  States nails 234 and 5 become aggravated and they bleed when she tries to cut them and more and more difficult to take care of herself.  Patient does not smoke likes to be active   Review of Systems  All other systems reviewed and are negative.      Objective:  Physical Exam Vitals and nursing note reviewed.  Constitutional:      Appearance: She is well-developed.  Pulmonary:     Effort: Pulmonary effort is normal.  Musculoskeletal:        General: Normal range of motion.  Skin:    General: Skin is warm.  Neurological:     Mental Status: She is alert.    Neurovascular status found to be intact muscle strength adequate range of motion adequate.  Patient is found to have thickened dystrophic nailbeds to 345 of the left foot that are painful and she cannot cut.  Patient is noted to have good digital perfusion she is well oriented x3     Assessment:  Damage to 4 nailbeds on the left foot with pain and probable fungal infiltration failure to respond to conservative treatments     Plan:  H&P discussed condition reviewed different treatment options.  She is opted for nail removal and I recommended removal of nails 2345 on the left and patient wants this done understanding procedure risk.  Patient signed consent form understanding permanent correction and today I infiltrated with 240 mg of Xylocaine Xylocaine and then after appropriate sterile prep I applied tourniquet to remove the nail beds exposed matrix applied phenol for applications 30 seconds to each bed followed by alcohol lavage sterile dressing.  Gave instructions on soaks and to leave dressings on 24 hours but take them off earlier if any throbbing were  to occur and patient will be seen back for reevaluation as needed encouraged to call questions concerns

## 2020-08-04 ENCOUNTER — Telehealth: Payer: Self-pay | Admitting: *Deleted

## 2020-08-04 ENCOUNTER — Other Ambulatory Visit: Payer: Self-pay | Admitting: Podiatry

## 2020-08-04 MED ORDER — DOXYCYCLINE HYCLATE 100 MG PO TABS: 100.0000 mg | ORAL_TABLET | Freq: Two times a day (BID) | ORAL | 0 refills | Status: DC

## 2020-08-04 NOTE — Progress Notes (Unsigned)
dox

## 2020-08-04 NOTE — Telephone Encounter (Signed)
Patient is calling because she seems to have a possible infection after wearing closed in shoes for work the weekend. She is requesting 1 more refill of pain medicine and an antibiotic. Please advise or send prescriptions to pharmacy on file

## 2020-08-04 NOTE — Telephone Encounter (Signed)
I will send in antibiotic. Can take advil or alleve for discomfort

## 2020-08-04 NOTE — Telephone Encounter (Signed)
Returned call to patient and informed that medication has been sent to pharmacy, gave other recommendations, verbalized understanding.

## 2020-08-14 ENCOUNTER — Encounter: Payer: Self-pay | Admitting: Podiatry

## 2020-08-14 ENCOUNTER — Ambulatory Visit: Payer: BC Managed Care – PPO | Admitting: Podiatry

## 2020-08-14 ENCOUNTER — Other Ambulatory Visit: Payer: Self-pay

## 2020-08-14 DIAGNOSIS — L03032 Cellulitis of left toe: Secondary | ICD-10-CM | POA: Diagnosis not present

## 2020-08-14 NOTE — Progress Notes (Signed)
Subjective:   Patient ID: April Parsons, female   DOB: 40 y.o.   MRN: [id number]   HPI Patient presents concerned about some redness and bleeding of her nailbeds after removal 234 and 5/2 weeks ago   ROS      Objective:  Physical Exam  Neurovascular status intact with red like irritation of the nailbeds localized no proximal edema or edema drainage noted      Assessment:  Possibility for very localized paronychia infection of the nailbed left overall healing appropriately     Plan:  H&P reviewed condition explained continued soaks padding therapy open toed shoes and I do think this will heal uneventfully and unless any further redness swelling or other issues were to occur we will not use an antibiotic currently

## 2020-08-29 DIAGNOSIS — F900 Attention-deficit hyperactivity disorder, predominantly inattentive type: Secondary | ICD-10-CM | POA: Diagnosis not present

## 2020-09-11 ENCOUNTER — Other Ambulatory Visit: Payer: Self-pay | Admitting: Gastroenterology

## 2020-09-11 DIAGNOSIS — R1010 Upper abdominal pain, unspecified: Secondary | ICD-10-CM

## 2020-09-12 DIAGNOSIS — F411 Generalized anxiety disorder: Secondary | ICD-10-CM | POA: Diagnosis not present

## 2020-09-25 ENCOUNTER — Emergency Department (HOSPITAL_COMMUNITY): Payer: BC Managed Care – PPO

## 2020-09-25 ENCOUNTER — Emergency Department (HOSPITAL_COMMUNITY)
Admission: EM | Admit: 2020-09-25 | Discharge: 2020-09-25 | Disposition: A | Discharge status: Home / Self Care | Payer: BC Managed Care – PPO | Attending: Emergency Medicine | Admitting: Emergency Medicine

## 2020-09-25 ENCOUNTER — Other Ambulatory Visit: Payer: Self-pay

## 2020-09-25 DIAGNOSIS — R002 Palpitations: Secondary | ICD-10-CM | POA: Diagnosis not present

## 2020-09-25 DIAGNOSIS — F909 Attention-deficit hyperactivity disorder, unspecified type: Secondary | ICD-10-CM | POA: Diagnosis not present

## 2020-09-25 DIAGNOSIS — E039 Hypothyroidism, unspecified: Secondary | ICD-10-CM

## 2020-09-25 DIAGNOSIS — Z79899 Other long term (current) drug therapy: Secondary | ICD-10-CM | POA: Insufficient documentation

## 2020-09-25 DIAGNOSIS — R079 Chest pain, unspecified: Secondary | ICD-10-CM | POA: Diagnosis not present

## 2020-09-25 DIAGNOSIS — I471 Supraventricular tachycardia: Secondary | ICD-10-CM | POA: Diagnosis not present

## 2020-09-25 DIAGNOSIS — R0789 Other chest pain: Secondary | ICD-10-CM | POA: Diagnosis not present

## 2020-09-25 DIAGNOSIS — R42 Dizziness and giddiness: Secondary | ICD-10-CM | POA: Diagnosis not present

## 2020-09-25 LAB — COMPREHENSIVE METABOLIC PANEL
ALT: 20 U/L (ref 0–44)
AST: 22 U/L (ref 15–41)
Albumin: 4.2 g/dL (ref 3.5–5.0)
Alkaline Phosphatase: 31 U/L — ABNORMAL LOW (ref 38–126)
Anion gap: 8 (ref 5–15)
BUN: 14 mg/dL (ref 6–20)
CO2: 27 mmol/L (ref 22–32)
Calcium: 9.2 mg/dL (ref 8.9–10.3)
Chloride: 106 mmol/L (ref 98–111)
Creatinine, Ser: 0.7 mg/dL (ref 0.44–1.00)
GFR, Estimated: >60 mL/min (ref >60)
Glucose, Bld: 88 mg/dL (ref 70–99)
Potassium: 3.2 mmol/L — ABNORMAL LOW (ref 3.5–5.1)
Sodium: 141 mmol/L (ref 135–145)
Total Bilirubin: 0.6 mg/dL (ref 0.3–1.2)
Total Protein: 6.6 g/dL (ref 6.5–8.1)

## 2020-09-25 LAB — TROPONIN I (HIGH SENSITIVITY)
Troponin I (High Sensitivity): 43 ng/L — ABNORMAL HIGH (ref <18)
Troponin I (High Sensitivity): 147 ng/L (ref <18)

## 2020-09-25 LAB — CBC
HCT: 39.6 % (ref 36.0–46.0)
Hemoglobin: 13.2 g/dL (ref 12.0–15.0)
MCH: 31.6 pg (ref 26.0–34.0)
MCHC: 33.3 g/dL (ref 30.0–36.0)
MCV: 94.7 fL (ref 80.0–100.0)
Platelets: 254 10*3/uL (ref 150–400)
RBC: 4.18 MIL/uL (ref 3.87–5.11)
RDW: 12.8 % (ref 11.5–15.5)
WBC: 8.1 10*3/uL (ref 4.0–10.5)
nRBC: 0 % (ref 0.0–0.2)

## 2020-09-25 LAB — D-DIMER, QUANTITATIVE: D-Dimer, Quant: <0.27 ug/mL-FEU (ref 0.00–0.50)

## 2020-09-25 LAB — MAGNESIUM: Magnesium: 2 mg/dL (ref 1.7–2.4)

## 2020-09-25 LAB — TSH: TSH: 1.367 u[IU]/mL (ref 0.350–4.500)

## 2020-09-25 MED ORDER — DILTIAZEM HCL 30 MG PO TABS: 30.0000 mg | ORAL_TABLET | Freq: Four times a day (QID) | ORAL | 0 refills | Status: DC | PRN

## 2020-09-25 MED ORDER — POTASSIUM CHLORIDE CRYS ER 20 MEQ PO TBCR
40.0000 meq | EXTENDED_RELEASE_TABLET | Freq: Once | ORAL | Status: AC
  Administered 2020-09-25: 40 meq via ORAL
  Filled 2020-09-25: qty 2

## 2020-09-25 MED ORDER — SODIUM CHLORIDE 0.9 % IV BOLUS
1000.0000 mL | Freq: Once | INTRAVENOUS | Status: AC
  Administered 2020-09-25: 1000 mL via INTRAVENOUS

## 2020-09-25 NOTE — Progress Notes (Signed)
SVT rate of 220

## 2020-09-25 NOTE — ED Provider Notes (Signed)
[facility] EMERGENCY DEPARTMENT Provider Note   CSN: [id number] Arrival date & time: 09/25/20  1108     History Chief Complaint  Patient presents with   Palpitations    SVT    April Parsons is a 40 y.o. female BIB EMS for palpitations. Patient was at work this morning when she had abrupt onset heart racing sensation, dizziness, sweating, and chest pressure. 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 She decided to lay down and coworkers checked her vitals, which showed HR up to 230. EMS was called and initial vitals per EMS were HR 230s with BP 80s/40s. She performed valsalva maneuver which seemed to bring her HR down to the 110s.  Now with mild persistent chest discomfort but has not had recurrence of her dizziness/sweatiness/near-syncope feeling since the valsalva maneuver. No history of similar symptoms.   Denies alcohol/drug use. No significant caffeine intake. No recent illness- denies fever, chills, cough, vomiting, diarrhea, or sick contacts. Only past medical hx is hypothyroidism and she has been stable on same dose of thyroid meds (30mg  Armour thyroid) for 10 years.     Past Medical History:  Diagnosis Date   Anemia     There are no problems to display for this patient.   No past surgical history on file.   OB History   No obstetric history on file.     Family History  Problem Relation Age of Onset   Cancer Father     Social History   Tobacco Use   Smoking status: Never   Smokeless tobacco: Never  Vaping Use   Vaping Use: Never used  Substance Use Topics   Alcohol use: No    Alcohol/week: 0.0 standard drinks   Drug use: No    Home Medications Prior to Admission medications   Medication Sig Start Date End Date Taking? Authorizing Provider  amphetamine-dextroamphetamine (ADDERALL XR) 15 MG 24 hr capsule Take 15 mg by mouth every morning. 08/30/20  Yes [provider]  cetirizine (ZYRTEC) 10 MG tablet     Yes [provider]  diltiazem (CARDIZEM) 30 MG tablet Take 1 tablet (30 mg total) by mouth every 6 (six) hours as needed. 09/25/20 09/25/21 Yes 11/25/21, MD  pantoprazole (PROTONIX) 40 MG tablet Take 40 mg by mouth daily. 06/09/20  Yes [provider]  sertraline (ZOLOFT) 100 MG tablet Take 100 mg by mouth at bedtime. 08/29/20  Yes [provider]  thyroid (ARMOUR) 30 MG tablet Take 30 mg by mouth daily.   Yes [provider]  doxycycline (VIBRA-TABS) 100 MG tablet Take 1 tablet (100 mg total) by mouth 2 (two) times daily. 08/04/20   08/06/20, DPM    Allergies    Yeast-related products  Review of Systems   Review of Systems  Constitutional:  Negative for chills and fever.  HENT:  Negative for rhinorrhea.   Respiratory:  Negative for cough and shortness of breath.   Cardiovascular:  Positive for chest pain and palpitations.  Gastrointestinal:  Negative for diarrhea, nausea and vomiting.  Neurological:  Positive for dizziness. Negative for syncope.   Physical Exam Updated Vital Signs BP 113/75   Pulse 99   Temp 98 F (36.7 C) (Oral)   Resp 13   Ht 5\' 7"  (1.702 m)   Wt 59 kg   LMP 08/29/2020 (Exact Date)   SpO2 99%   BMI 20.36 kg/m   Physical Exam Constitutional:  General: She is not in acute distress.    Appearance: Normal appearance.  HENT:     Head: Normocephalic and atraumatic.     Mouth/Throat:     Mouth: Mucous membranes are moist.  Cardiovascular:     Rate and Rhythm: Tachycardia present.     Heart sounds: Normal heart sounds.  Pulmonary:     Effort: Pulmonary effort is normal.     Breath sounds: Normal breath sounds.  Abdominal:     Palpations: Abdomen is soft.     Tenderness: There is no abdominal tenderness.  Skin:    General: Skin is warm and dry.     Capillary Refill: Capillary refill takes less than 2 seconds.  Neurological:     General: No focal deficit present.     Mental Status: She is alert. Mental  status is at baseline.    ED Results / Procedures / Treatments   Labs (all labs ordered are listed, but only abnormal results are displayed) Labs Reviewed  COMPREHENSIVE METABOLIC PANEL - Abnormal; Notable for the following components:      Result Value   Potassium 3.2 (*)    Alkaline Phosphatase 31 (*)    All other components within normal limits  TROPONIN I (HIGH SENSITIVITY) - Abnormal; Notable for the following components:   Troponin I (High Sensitivity) 43 (*)    All other components within normal limits  TROPONIN I (HIGH SENSITIVITY) - Abnormal; Notable for the following components:   Troponin I (High Sensitivity) 147 (*)    All other components within normal limits  CBC  TSH  MAGNESIUM  D-DIMER, QUANTITATIVE    EKG EKG Interpretation  Date/Time:  Thursday September 25 2020 11:13:34 EDT Ventricular Rate:  113 PR Interval:  113 QRS Duration: 75 QT Interval:  295 QTC Calculation: 405 R Axis:   83 Text Interpretation: Sinus tachycardia RSR' in V1 or V2, probably normal variant Nonspecific repol abnormality, diffuse leads No prior ECG for comparison. No STEMI Confirmed by Theda Belfast ([id number]) on 09/25/2020 11:15:10 AM  Radiology DG Chest 2 View  Result Date: 09/25/2020 CLINICAL DATA:  Chest discomfort EXAM: CHEST - 2 VIEW COMPARISON:  None. FINDINGS: The heart size and mediastinal contours are within normal limits. Both lungs are clear. The visualized skeletal structures are unremarkable. IMPRESSION: No active cardiopulmonary disease. Electronically Signed   By: Acquanetta Belling M.D.   On: 09/25/2020 12:51    Procedures Procedures   Medications Ordered in ED Medications  sodium chloride 0.9 % bolus 1,000 mL (0 mLs Intravenous Stopped 09/25/20 1449)  potassium chloride SA (KLOR-CON) CR tablet 40 mEq (40 mEq Oral Given 09/25/20 1448)    ED Course  I have reviewed the triage vital signs and the nursing notes.  Pertinent labs & imaging results that were available during my  care of the patient were reviewed by me and considered in my medical decision making (see chart for details).    MDM Rules/Calculators/A&P                         40 year old female with hx of hypothyroidism presents via EMS for episode of palpitations, dizziness, and chest pressure. Patient tachycardic up to 230 with associated hypotension (80/40s) per EMS. Vitals improved with valsalva maneuver.  Suspect SVT although no prior history of SVT and no obvious trigger. She does take Adderall which could have potentially contributed, but is currently on a stable dose. Will obtain basic labs including CBC, CMP, mag  and give 1L fluid bolus. Will check troponin, D-dimer and CXR given her associated chest pain and check TSH given her hx of hypothyroidism.  EKG shows sinus tachycardia at 113bpm but is otherwise normal without delta wave or other changes. Labs demonstrate mild hypokalemia to 3.2 and mildly elevated troponin to 43, likely demand from HR in the 200s. D-dimer, mag, CBC, TSH, and CXR were all unremarkable.  Will obtain second troponin to ensure it is trending flat. Given PO potassium x1 for her hypokalemia as well as 1L fluid bolus.  Repeat troponin elevated to 147 (43>147). Discussed case with Dr. Izora Ribas from cardiology who evaluated patient at bedside and feels she is stable for discharge home with Diltiazem 30mg  q6h prn and outpatient cardiology follow up.   Final Clinical Impression(s) / ED Diagnoses Final diagnoses:  SVT (supraventricular tachycardia) (HCC)    Rx / DC Orders ED Discharge Orders          Ordered    diltiazem (CARDIZEM) 30 MG tablet  Every 6 hours PRN        09/25/20 1702           11/25/20, MD PGY-2 [facility]   [facility], MD 09/25/20 1710    [place], 11/25/20, MD 09/25/20 2105

## 2020-09-25 NOTE — Consult Note (Signed)
Cardiology Consult Note:   Patient ID: April Parsons MRN: [id number]; DOB: [birth year]   Admission date: 09/25/2020  PCP:  Patient, No Pcp Per (Inactive)   [facility] Providers Cardiologist:  New to [facility]{  Chief Complaint:  tachycardia  Patient Profile:   April Parsons is a 40 y.o. female with ADHD on stable medication, hypothyroidism well controlled who is being seen 09/25/2020 for the evaluation of SVT.  History of Present Illness:   [name] notes that she is feeling has never had palpitations until this AM.  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  Tried deep breathing but with no benefit.  Had DOE and chest fluttering.  Was unable to abort this with her DMD and EMS was called.  EMS was able to capture tachycardia (see separate progress note with scan).  Patient was asked to bare down and Valsalva; this aborted her palpitations.  Has had no chest pain, chest pressure, chest tightness, chest stinging .  Patient exertion notable for working on her feet all dayand feels no symptoms.  No shortness of breath, DOE back in normal rhythm.  No PND or orthopnea.  No weight gain, leg swelling , or abdominal swelling.  No syncope or near syncope even during SVT. Notes  no palpitations or funny heart beats prior to this event.   No leg claudication.  No change in her ADHD medications recently.  Minimal alcohol intakes.  No substance abuse.  No change in her thyroid medication.  TSH WNL.  Past Medical History:  Diagnosis Date   Anemia     No past surgical history on file.   Medications Prior to Admission: Prior to Admission medications   Medication Sig Start Date End Date Taking? Authorizing Provider  amphetamine-dextroamphetamine (ADDERALL XR) 15 MG 24 hr capsule Take 15 mg by mouth every morning. 08/30/20  Yes [provider]  cetirizine (ZYRTEC) 10 MG tablet    Yes [provider]  pantoprazole  (PROTONIX) 40 MG tablet Take 40 mg by mouth daily. 06/09/20  Yes [provider]  sertraline (ZOLOFT) 100 MG tablet Take 100 mg by mouth at bedtime. 08/29/20  Yes [provider]  thyroid (ARMOUR) 30 MG tablet Take 30 mg by mouth daily.   Yes [provider]  doxycycline (VIBRA-TABS) 100 MG tablet Take 1 tablet (100 mg total) by mouth 2 (two) times daily. 08/04/20   Lenn Sink, DPM     Allergies:    Allergies  Allergen Reactions   Yeast-Related Products Other (See Comments)    Stomach pain    Social History:   Social History   Socioeconomic History   Marital status: Married    Spouse name: April Parsons   Number of children: 0   Years of education: Not on file   Highest education level: Not on file  Occupational History   Not on file  Tobacco Use   Smoking status: Never   Smokeless tobacco: Never  Vaping Use   Vaping Use: Never used  Substance and Sexual Activity   Alcohol use: No    Alcohol/week: 0.0 standard drinks   Drug use: No   Sexual activity: Yes  Other Topics Concern   Not on file  Social History Narrative   Not on file   Social Determinants of Health   Financial Resource Strain: Not on file  Food Insecurity: Not on file  Transportation Needs: Not on file  Physical Activity: Not on file  Stress: Not on file  Social Connections: Not on file  Intimate Partner Violence: Not on file    Family History:   The patient's family history includes Cancer in her father.   History of coronary artery disease notable for no members. History of heart failure notable for no members. History of arrhythmia notable for no members. Some family members have noted palpitations.  ROS:  Please see the history of present illness.  All other ROS reviewed and negative.     Physical Exam/Data:   Vitals:   09/25/20 1500 09/25/20 1534 09/25/20 1600 09/25/20 1630  BP: 115/63 104/66 102/69 113/75  Pulse: 85 90 87 99  Resp: 16 13 19 13   Temp:       TempSrc:      SpO2: 100% 100% 98% 99%  Weight:      Height:        Intake/Output Summary (Last 24 hours) at 09/25/2020 1646 Last data filed at 09/25/2020 1449 Gross per 24 hour  Intake 1000 ml  Output --  Net 1000 ml   Last 3 Weights 09/25/2020 02/23/2017 08/23/2016  Weight (lbs) 130 lb 128 lb 127 lb  Weight (kg) 58.968 kg 58.06 kg 57.607 kg     Body mass index is 20.36 kg/m.  General:  Well nourished, well developed, in no acute distress HEENT: normal Lymph: no adenopathy Neck: no JVD Endocrine:  No thryomegaly Vascular: No carotid bruits; FA pulses 2+ bilaterally without bruits  Cardiac:  normal S1, S2; RRR; no murmur  Lungs:  clear to auscultation bilaterally, no wheezing, rhonchi or rales  Abd: soft, nontender, no hepatomegaly  Ext: no edema Musculoskeletal:  No deformities, BUE and BLE strength normal and equal Skin: warm and dry  Neuro:  CNs 2-12 intact, no focal abnormalities noted Psych:  Normal affect    EKG:  The ECG that was done  was personally reviewed and demonstrates 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) rate 229 with diffuse ST depressions (see scanned progress note) Post conversion Sinus tachycardia rate 113 borderline LVH   Laboratory Data:  High Sensitivity Troponin:   Recent Labs  Lab 09/25/20 1236 09/25/20 1449  TROPONINIHS 43* 147*      Chemistry Recent Labs  Lab 09/25/20 1236  NA 141  K 3.2*  CL 106  CO2 27  GLUCOSE 88  BUN 14  CREATININE 0.70  CALCIUM 9.2  GFRNONAA >60  ANIONGAP 8    Recent Labs  Lab 09/25/20 1236  PROT 6.6  ALBUMIN 4.2  AST 22  ALT 20  ALKPHOS 31*  BILITOT 0.6   Hematology Recent Labs  Lab 09/25/20 1236  WBC 8.1  RBC 4.18  HGB 13.2  HCT 39.6  MCV 94.7  MCH 31.6  MCHC 33.3  RDW 12.8  PLT 254   BNPNo results for input(s): BNP, PROBNP in the last 168 hours.  DDimer  Recent Labs  Lab 09/25/20 1236  DDIMER <0.27     Radiology/Studies:  DG Chest 2 View  Result Date: 09/25/2020 CLINICAL DATA:  Chest discomfort  EXAM: CHEST - 2 VIEW COMPARISON:  None. FINDINGS: The heart size and mediastinal contours are within normal limits. Both lungs are clear. The visualized skeletal structures are unremarkable. IMPRESSION: No active cardiopulmonary disease. Electronically Signed   By: 11/25/2020 M.D.   On: 09/25/2020 12:51     Assessment and Plan:   SVT in the setting of thyroid disease (stable) and ADHD (on stable medications - suspect AVNRT - discussed abortive maneuvers  - recommend  diltiazem 30 mg PO q6hr PRN Palpitations - ideally would not start standing medication given her history of low resting blood pressure - we have arranged follow up later this month:  will get echocardiogram, discuss potential heart monitor, and decide on escalation in therapy - discussed with patient, ED team, and family   [facility] will sign off.   Medication Recommendations:  Diltiazem as above Other recommendations (labs, testing, etc):  outpatient echo (will see in follow up first) Follow up as an outpatient:  10/13/20 with Dr. Izora Ribas    For questions or updates, please contact [facility] Please consult [email or web address] for contact info under     Signed, Christell Constant, MD  09/25/2020 4:46 PM

## 2020-09-25 NOTE — Discharge Instructions (Addendum)
You were seen in the emergency department for an episode of heart racing, dizziness, sweating, and chest pain. You were diagnosed with SVT (supraventricular tachycardia) and seen by a cardiologist.  We have prescribed a medication called Diltiazem to take as needed if this recurs. It can often resolve with valsalva maneuver (or "bearing down") so you can try this as well.

## 2020-09-25 NOTE — ED Triage Notes (Signed)
Pt BIB EMS while at work c/o sudden onset of chest tightness, dizziness and SOB. Initial BP 80s/40s, SVT in the 230s. Converted 108 after valsalva by EMS. H/O Thyroid dse, on 30mg  Armour Thyroid. Symptoms are better per pt.  118/76 108  98% RA L AC 18g

## 2020-09-25 NOTE — ED Notes (Signed)
Pt transported to XRAY °

## 2020-09-26 ENCOUNTER — Ambulatory Visit
Admission: RE | Admit: 2020-09-26 | Discharge: 2020-09-26 | Disposition: A | Discharge status: Home / Self Care | Payer: BC Managed Care – PPO | Source: Ambulatory Visit | Attending: Gastroenterology | Admitting: Gastroenterology

## 2020-09-26 DIAGNOSIS — R1010 Upper abdominal pain, unspecified: Secondary | ICD-10-CM

## 2020-09-26 DIAGNOSIS — F900 Attention-deficit hyperactivity disorder, predominantly inattentive type: Secondary | ICD-10-CM | POA: Diagnosis not present

## 2020-09-26 DIAGNOSIS — R109 Unspecified abdominal pain: Secondary | ICD-10-CM | POA: Diagnosis not present

## 2020-10-02 DIAGNOSIS — F411 Generalized anxiety disorder: Secondary | ICD-10-CM | POA: Diagnosis not present

## 2020-10-03 DIAGNOSIS — E876 Hypokalemia: Secondary | ICD-10-CM | POA: Diagnosis not present

## 2020-10-03 DIAGNOSIS — N643 Galactorrhea not associated with childbirth: Secondary | ICD-10-CM | POA: Diagnosis not present

## 2020-10-03 DIAGNOSIS — I471 Supraventricular tachycardia: Secondary | ICD-10-CM | POA: Diagnosis not present

## 2020-10-07 ENCOUNTER — Other Ambulatory Visit: Payer: Self-pay | Admitting: Obstetrics and Gynecology

## 2020-10-07 DIAGNOSIS — N6310 Unspecified lump in the right breast, unspecified quadrant: Secondary | ICD-10-CM

## 2020-10-09 ENCOUNTER — Other Ambulatory Visit: Payer: Self-pay | Admitting: Physician Assistant

## 2020-10-09 ENCOUNTER — Other Ambulatory Visit: Payer: Self-pay | Admitting: Obstetrics and Gynecology

## 2020-10-09 DIAGNOSIS — N6310 Unspecified lump in the right breast, unspecified quadrant: Secondary | ICD-10-CM

## 2020-10-09 DIAGNOSIS — N643 Galactorrhea not associated with childbirth: Secondary | ICD-10-CM

## 2020-10-09 DIAGNOSIS — R599 Enlarged lymph nodes, unspecified: Secondary | ICD-10-CM

## 2020-10-13 ENCOUNTER — Other Ambulatory Visit: Payer: Self-pay

## 2020-10-13 ENCOUNTER — Encounter: Payer: Self-pay | Admitting: Internal Medicine

## 2020-10-13 ENCOUNTER — Ambulatory Visit: Payer: BC Managed Care – PPO | Admitting: Internal Medicine

## 2020-10-13 VITALS — BP 98/60 | HR 80 | Ht 67.0 in | Wt 132.0 lb

## 2020-10-13 DIAGNOSIS — I471 Supraventricular tachycardia, unspecified: Secondary | ICD-10-CM | POA: Insufficient documentation

## 2020-10-13 DIAGNOSIS — E039 Hypothyroidism, unspecified: Secondary | ICD-10-CM | POA: Diagnosis not present

## 2020-10-13 DIAGNOSIS — F909 Attention-deficit hyperactivity disorder, unspecified type: Secondary | ICD-10-CM | POA: Diagnosis not present

## 2020-10-13 NOTE — Patient Instructions (Signed)
Medication Instructions:  Your physician recommends that you continue on your current medications as directed. Please refer to the Current Medication list given to you today.  *If you need a refill on your cardiac medications before your next appointment, please call your pharmacy*   Lab Work: NONE If you have labs (blood work) drawn today and your tests are completely normal, you will receive your results only by: . MyChart Message (if you have MyChart) OR . A paper copy in the mail If you have any lab test that is abnormal or we need to change your treatment, we will call you to review the results.   Testing/Procedures: NONE   Follow-Up: AS NEEDED At [facility], you and your health needs are our priority.  As part of our continuing mission to provide you with exceptional heart care, we have created designated Provider Care Teams.  These Care Teams include your primary Cardiologist (physician) and Advanced Practice Providers (APPs -  Physician Assistants and Nurse Practitioners) who all work together to provide you with the care you need, when you need it.    

## 2020-10-13 NOTE — Progress Notes (Signed)
Cardiology Office Note:    Date:  10/13/2020   ID:  April Parsons, DOB May 10, 1980, MRN [id number]  PCP:  Patient, No Pcp Per (Inactive)   [facility] Providers Cardiologist:  Werner Lean, MD     Referring MD: No ref. provider found   CC:  Follow up SVT  History of Present Illness:    April Parsons is a 40 y.o. female with a hx of SVT 09/25/20 (see scan), ADHD on stable medication, hypothyroidism who presents for follow up from 09/25/20 tachycardia.  Patient notes that she is doing well.  Since ED visit has had no further symptoms.   No chest pain or pressure .  No SOB/DOE and no PND/Orthopnea.  No weight gain or leg swelling.  No palpitations or syncope.  Had needed to diltiazem.   Past Medical History:  Diagnosis Date   Anemia     No past surgical history on file.  Current Medications: Current Meds  Medication Sig   amphetamine-dextroamphetamine (ADDERALL XR) 15 MG 24 hr capsule Take 15 mg by mouth every morning.   cetirizine (ZYRTEC) 10 MG tablet    diltiazem (CARDIZEM) 30 MG tablet Take 1 tablet (30 mg total) by mouth every 6 (six) hours as needed.   pantoprazole (PROTONIX) 40 MG tablet Take 40 mg by mouth daily.   sertraline (ZOLOFT) 100 MG tablet Take 100 mg by mouth at bedtime.   thyroid (ARMOUR) 30 MG tablet Take 30 mg by mouth daily.     Allergies:   Patient has no active allergies.   Social History   Socioeconomic History   Marital status: Married    Spouse name: [place] Carbo   Number of children: 0   Years of education: Not on file   Highest education level: Not on file  Occupational History   Not on file  Tobacco Use   Smoking status: Never   Smokeless tobacco: Never  Vaping Use   Vaping Use: Never used  Substance and Sexual Activity   Alcohol use: No    Alcohol/week: 0.0 standard drinks   Drug use: No   Sexual activity: Yes  Other Topics Concern   Not on file  Social History Narrative   Not on file   Social Determinants of  Health   Financial Resource Strain: Not on file  Food Insecurity: Not on file  Transportation Needs: Not on file  Physical Activity: Not on file  Stress: Not on file  Social Connections: Not on file    Social: Art therapist, married; 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  ROS:   Please see the history of present illness.     All other systems reviewed and are negative.  EKGs/Labs/Other Studies Reviewed:    The following studies were reviewed today:  EKG:  EKG is  ordered today.  The ekg ordered today demonstrates  10/13/20: SR rate 80 WNL 09/25/20: SVT rate 229 with ST depressions  Recent Labs: 09/25/2020: ALT 20; BUN 14; Creatinine, Ser 0.70; Hemoglobin 13.2; Magnesium 2.0; Platelets 254; Potassium 3.2; Sodium 141; TSH 1.367  Recent Lipid Panel No results found for: CHOL, TRIG, HDL, CHOLHDL, VLDL, LDLCALC, LDLDIRECT   Physical Exam:    VS:  BP 98/60   Pulse 80   Ht '5\' 7"'  (1.702 m)   Wt 132 lb (59.9 kg)   SpO2 98%   BMI 20.67 kg/m     Wt Readings from Last 3 Encounters:  10/13/20 132  lb (59.9 kg)  09/25/20 130 lb (59 kg)  02/23/17 128 lb (58.1 kg)     GEN:  Well nourished, well developed in no acute distress HEENT: Normal NECK: No JVD LYMPHATICS: No lymphadenopathy CARDIAC: RRR, no murmurs, rubs, gallops RESPIRATORY:  Clear to auscultation without rales, wheezing or rhonchi  ABDOMEN: Soft, non-tender, non-distended MUSCULOSKELETAL:  No edema; No deformity  SKIN: Warm and dry NEUROLOGIC:  Alert and oriented x 3 PSYCHIATRIC:  Normal affect   ASSESSMENT:    1. SVT (supraventricular tachycardia) ([place])   2. Hypothyroidism, unspecified type   3. Attention deficit hyperactivity disorder (ADHD), unspecified ADHD type    PLAN:    SVT in the setting of thyroid disease (stable) and ADHD (on stable medications) and hyopkalemia (resolved) - discussed abortive maneuvers  - has PRN diltiazem 30 mg PO  q6hr PRN  - discussed heart monitoring and echo; will defer unless repeat SVT  Will plan for PRN follow up unless new symptoms or abnormal test results warranting change in plan, if needing PRN diltiazem we will get echo and schedule f/u  Would be reasonable for  APP Follow up         Medication Adjustments/Labs and Tests Ordered: Current medicines are reviewed at length with the patient today.  Concerns regarding medicines are outlined above.  Orders Placed This Encounter  Procedures   EKG 12-Lead    No orders of the defined types were placed in this encounter.   Patient Instructions  Medication Instructions:  Your physician recommends that you continue on your current medications as directed. Please refer to the Current Medication list given to you today.  *If you need a refill on your cardiac medications before your next appointment, please call your pharmacy*   Lab Work: NONE If you have labs (blood work) drawn today and your tests are completely normal, you will receive your results only by: [place] (if you have MyChart) OR A paper copy in the mail If you have any lab test that is abnormal or we need to change your treatment, we will call you to review the results.   Testing/Procedures: NONE   Follow-Up:  AS NEEDED  At [facility], you and your health needs are our priority.  As part of our continuing mission to provide you with exceptional heart care, we have created designated Provider Care Teams.  These Care Teams include your primary Cardiologist (physician) and Advanced Practice Providers (APPs -  Physician Assistants and Nurse Practitioners) who all work together to provide you with the care you need, when you need it.     Signed, Werner Lean, MD  10/13/2020 10:57 AM    [place]

## 2020-11-04 DIAGNOSIS — F900 Attention-deficit hyperactivity disorder, predominantly inattentive type: Secondary | ICD-10-CM | POA: Diagnosis not present

## 2020-11-21 ENCOUNTER — Ambulatory Visit
Admission: RE | Admit: 2020-11-21 | Discharge: 2020-11-21 | Disposition: A | Discharge status: Home / Self Care | Payer: BC Managed Care – PPO | Source: Ambulatory Visit | Attending: Physician Assistant | Admitting: Physician Assistant

## 2020-11-21 ENCOUNTER — Other Ambulatory Visit: Payer: Self-pay | Admitting: Physician Assistant

## 2020-11-21 ENCOUNTER — Other Ambulatory Visit: Payer: Self-pay

## 2020-11-21 DIAGNOSIS — N643 Galactorrhea not associated with childbirth: Secondary | ICD-10-CM

## 2020-11-21 DIAGNOSIS — N6452 Nipple discharge: Secondary | ICD-10-CM | POA: Diagnosis not present

## 2020-11-21 DIAGNOSIS — R922 Inconclusive mammogram: Secondary | ICD-10-CM | POA: Diagnosis not present

## 2020-11-21 DIAGNOSIS — R599 Enlarged lymph nodes, unspecified: Secondary | ICD-10-CM

## 2020-11-28 DIAGNOSIS — Z23 Encounter for immunization: Secondary | ICD-10-CM | POA: Diagnosis not present

## 2020-11-28 DIAGNOSIS — Z0000 Encounter for general adult medical examination without abnormal findings: Secondary | ICD-10-CM | POA: Diagnosis not present

## 2020-12-18 DIAGNOSIS — F900 Attention-deficit hyperactivity disorder, predominantly inattentive type: Secondary | ICD-10-CM | POA: Diagnosis not present

## 2020-12-21 DIAGNOSIS — R42 Dizziness and giddiness: Secondary | ICD-10-CM | POA: Diagnosis not present

## 2020-12-29 ENCOUNTER — Telehealth: Payer: Self-pay | Admitting: Podiatry

## 2020-12-29 NOTE — Telephone Encounter (Signed)
Patient called she paid to have 4 toe nails permanently removed and they have all grown back and they are not smooth. She wants to know will she have to pay for the procedure all over again, since they weren't suppose to grow back ? Please Advise

## 2020-12-29 NOTE — Telephone Encounter (Signed)
They generally don't grow back that fast. Have her come in 11:45 and I will check them for free (Thursday)

## 2020-12-30 NOTE — Telephone Encounter (Signed)
Due to her work schedule she could not come in at the time given , she is scheduled for 12/28 815a .

## 2021-01-14 ENCOUNTER — Ambulatory Visit: Payer: BC Managed Care – PPO | Admitting: Podiatry

## 2021-01-14 ENCOUNTER — Other Ambulatory Visit: Payer: Self-pay

## 2021-01-14 ENCOUNTER — Encounter: Payer: Self-pay | Admitting: Podiatry

## 2021-01-14 DIAGNOSIS — L600 Ingrowing nail: Secondary | ICD-10-CM

## 2021-01-14 NOTE — Progress Notes (Signed)
Subjective:   Patient ID: April Parsons, female   DOB: 40 y.o.   MRN: [id number]   HPI Patient presents stating she was just concerned about some scabs on her lesser nails and wanted them checked   ROS      Objective:  Physical Exam  Neurovascular status intact with some scab tissue on the second third and fourth nailbeds left no indications currently nail growth     Assessment:  Appears to be more scab formation I do not think at this point that it appears to be regrowth nails     Plan:  Educated her on this and went ahead and smoothed off the nails and this can be done periodically and will be seen back if true nail were to regrow

## 2021-02-05 DIAGNOSIS — R232 Flushing: Secondary | ICD-10-CM | POA: Diagnosis not present

## 2021-02-05 DIAGNOSIS — R109 Unspecified abdominal pain: Secondary | ICD-10-CM | POA: Diagnosis not present

## 2021-02-05 DIAGNOSIS — B9689 Other specified bacterial agents as the cause of diseases classified elsewhere: Secondary | ICD-10-CM | POA: Diagnosis not present

## 2021-02-05 DIAGNOSIS — T781XXD Other adverse food reactions, not elsewhere classified, subsequent encounter: Secondary | ICD-10-CM | POA: Diagnosis not present

## 2021-02-19 DIAGNOSIS — B9689 Other specified bacterial agents as the cause of diseases classified elsewhere: Secondary | ICD-10-CM | POA: Diagnosis not present

## 2021-02-19 DIAGNOSIS — T781XXD Other adverse food reactions, not elsewhere classified, subsequent encounter: Secondary | ICD-10-CM | POA: Diagnosis not present

## 2021-02-19 DIAGNOSIS — R109 Unspecified abdominal pain: Secondary | ICD-10-CM | POA: Diagnosis not present

## 2021-02-19 DIAGNOSIS — D802 Selective deficiency of immunoglobulin A [IgA]: Secondary | ICD-10-CM | POA: Diagnosis not present

## 2021-02-27 DIAGNOSIS — F900 Attention-deficit hyperactivity disorder, predominantly inattentive type: Secondary | ICD-10-CM | POA: Diagnosis not present

## 2021-05-13 DIAGNOSIS — F900 Attention-deficit hyperactivity disorder, predominantly inattentive type: Secondary | ICD-10-CM | POA: Diagnosis not present

## 2021-08-06 DIAGNOSIS — F900 Attention-deficit hyperactivity disorder, predominantly inattentive type: Secondary | ICD-10-CM | POA: Diagnosis not present

## 2021-09-30 ENCOUNTER — Encounter: Payer: Self-pay | Admitting: Internal Medicine

## 2021-10-02 DIAGNOSIS — R079 Chest pain, unspecified: Secondary | ICD-10-CM | POA: Diagnosis not present

## 2021-10-02 DIAGNOSIS — R9431 Abnormal electrocardiogram [ECG] [EKG]: Secondary | ICD-10-CM | POA: Diagnosis not present

## 2021-10-02 DIAGNOSIS — E039 Hypothyroidism, unspecified: Secondary | ICD-10-CM | POA: Diagnosis not present

## 2021-10-02 DIAGNOSIS — I471 Supraventricular tachycardia: Secondary | ICD-10-CM | POA: Diagnosis not present

## 2021-10-28 DIAGNOSIS — F900 Attention-deficit hyperactivity disorder, predominantly inattentive type: Secondary | ICD-10-CM | POA: Diagnosis not present

## 2021-11-03 ENCOUNTER — Encounter: Payer: Self-pay | Admitting: Internal Medicine

## 2021-11-13 ENCOUNTER — Encounter (HOSPITAL_COMMUNITY): Payer: Self-pay | Admitting: Emergency Medicine

## 2021-11-13 ENCOUNTER — Emergency Department (HOSPITAL_COMMUNITY)
Admission: EM | Admit: 2021-11-13 | Discharge: 2021-11-13 | Disposition: A | Discharge status: Home / Self Care | Payer: BC Managed Care – PPO | Attending: Emergency Medicine | Admitting: Emergency Medicine

## 2021-11-13 ENCOUNTER — Emergency Department (HOSPITAL_COMMUNITY): Payer: BC Managed Care – PPO

## 2021-11-13 ENCOUNTER — Other Ambulatory Visit: Payer: Self-pay

## 2021-11-13 DIAGNOSIS — R000 Tachycardia, unspecified: Secondary | ICD-10-CM | POA: Diagnosis not present

## 2021-11-13 DIAGNOSIS — Z79899 Other long term (current) drug therapy: Secondary | ICD-10-CM | POA: Insufficient documentation

## 2021-11-13 DIAGNOSIS — E039 Hypothyroidism, unspecified: Secondary | ICD-10-CM | POA: Diagnosis not present

## 2021-11-13 DIAGNOSIS — I471 Supraventricular tachycardia, unspecified: Secondary | ICD-10-CM | POA: Insufficient documentation

## 2021-11-13 DIAGNOSIS — R0602 Shortness of breath: Secondary | ICD-10-CM | POA: Diagnosis not present

## 2021-11-13 DIAGNOSIS — R55 Syncope and collapse: Secondary | ICD-10-CM | POA: Diagnosis not present

## 2021-11-13 LAB — CBC
HCT: 39.4 % (ref 36.0–46.0)
Hemoglobin: 12.9 g/dL (ref 12.0–15.0)
MCH: 31.1 pg (ref 26.0–34.0)
MCHC: 32.7 g/dL (ref 30.0–36.0)
MCV: 94.9 fL (ref 80.0–100.0)
Platelets: 334 10*3/uL (ref 150–400)
RBC: 4.15 MIL/uL (ref 3.87–5.11)
RDW: 12.3 % (ref 11.5–15.5)
WBC: 9.3 10*3/uL (ref 4.0–10.5)
nRBC: 0 % (ref 0.0–0.2)

## 2021-11-13 LAB — BASIC METABOLIC PANEL
Anion gap: 15 (ref 5–15)
BUN: 12 mg/dL (ref 6–20)
CO2: 18 mmol/L — ABNORMAL LOW (ref 22–32)
Calcium: 9.6 mg/dL (ref 8.9–10.3)
Chloride: 108 mmol/L (ref 98–111)
Creatinine, Ser: 0.91 mg/dL (ref 0.44–1.00)
GFR, Estimated: >60 mL/min (ref >60)
Glucose, Bld: 125 mg/dL — ABNORMAL HIGH (ref 70–99)
Potassium: 3.6 mmol/L (ref 3.5–5.1)
Sodium: 141 mmol/L (ref 135–145)

## 2021-11-13 LAB — MAGNESIUM: Magnesium: 2 mg/dL (ref 1.7–2.4)

## 2021-11-13 NOTE — Discharge Instructions (Signed)
Please follow-up with your cardiologist return to the ER for any new or concerning symptoms.  Hold off on caffeine, make sure you are hydrating well.

## 2021-11-13 NOTE — ED Triage Notes (Signed)
Patient here with complaint of progressively worsening shortness of breath and chest pain that started earlier today, history of SVT and states she believes she was in SVT. Patient arrives to triage hyperventilating, pain improved and respirations slows with coaching, EKG showed sinus tachycardia. Patient stated she did not feel like she was in SVT any more. Patient is alert, oriented, and in no apparent distress at this time.

## 2021-11-13 NOTE — ED Provider Notes (Signed)
[facility] EMERGENCY DEPARTMENT Provider Note   CSN: [id number] Arrival date & time: 11/13/21  1340     History  Chief Complaint  Patient presents with   Palpitations    April Parsons is a 41 y.o. female.   Palpitations Patient is a 41 year old female with a past medical history significant for SVT and hypothyroidism on thyroid medication  She is presenting to the emergency room today with episode of shortness of breath and chest tightness that began at approximately 12:30 PM.  She states she has a history of SVT and it felt similar to this in the past.  This is her fourth episode of SVT.  She follows with cardiology and was given a pill in the pocket Cardizem which she took several minutes after symptoms began.  She arrived in the emergency room approximately 145 and was brought to triage.   Patient denies any chest pain or leg swelling.  She denies any recreational drug use or alcohol use or withdrawal.  She states she feels primarily lightheaded and short of breath.      Home Medications Prior to Admission medications   Medication Sig Start Date End Date Taking? Authorizing Provider  amphetamine-dextroamphetamine (ADDERALL XR) 15 MG 24 hr capsule Take 15 mg by mouth every morning. 08/30/20   [provider]  cetirizine (ZYRTEC) 10 MG tablet     [provider]  diltiazem (CARDIZEM) 30 MG tablet Take 1 tablet (30 mg total) by mouth every 6 (six) hours as needed. 09/25/20 09/25/21  Maury Dus, MD  pantoprazole (PROTONIX) 40 MG tablet Take 40 mg by mouth daily. 06/09/20   [provider]  sertraline (ZOLOFT) 100 MG tablet Take 100 mg by mouth at bedtime. 08/29/20   [provider]  thyroid (ARMOUR) 30 MG tablet Take 30 mg by mouth daily.    [provider]      Allergies    Patient has no active allergies.    Review of Systems   Review of Systems  Cardiovascular:  Positive for palpitations.    Physical  Exam Updated Vital Signs BP 115/79   Pulse 92   Temp 98.1 F (36.7 C) (Oral)   Resp 16   SpO2 100%  Physical Exam Vitals and nursing note reviewed.  Constitutional:      General: She is not in acute distress.    Comments: Patient has eyes closed, hyperventilating appears quite anxious.  HENT:     Head: Normocephalic and atraumatic.     Nose: Nose normal.     Mouth/Throat:     Mouth: Mucous membranes are moist.  Eyes:     General: No scleral icterus. Cardiovascular:     Rate and Rhythm: Regular rhythm. Tachycardia present.     Pulses: Normal pulses.     Heart sounds: Normal heart sounds.     Comments: Heart rate by palpation 150, regular Pulmonary:     Effort: Pulmonary effort is normal. No respiratory distress.     Breath sounds: No wheezing.  Abdominal:     Palpations: Abdomen is soft.     Tenderness: There is no abdominal tenderness. There is no guarding or rebound.  Musculoskeletal:     Cervical back: Normal range of motion.     Right lower leg: No edema.     Left lower leg: No edema.     Comments: No lower extremity edema or calf tenderness  Skin:    General: Skin is warm and dry.  Capillary Refill: Capillary refill takes less than 2 seconds.  Neurological:     Mental Status: She is alert. Mental status is at baseline.  Psychiatric:        Mood and Affect: Mood normal.        Behavior: Behavior normal.     ED Results / Procedures / Treatments   Labs (all labs ordered are listed, but only abnormal results are displayed) Labs Reviewed  BASIC METABOLIC PANEL - Abnormal; Notable for the following components:      Result Value   CO2 18 (*)    Glucose, Bld 125 (*)    All other components within normal limits  CBC  MAGNESIUM  PREGNANCY, URINE    EKG None  Radiology DG Chest 2 View  Result Date: 11/13/2021 CLINICAL DATA:  Supraventricular tachycardia, near syncope EXAM: CHEST - 2 VIEW COMPARISON:  09/25/2020 FINDINGS: The heart size and mediastinal  contours are within normal limits. Both lungs are clear. The visualized skeletal structures are unremarkable. IMPRESSION: No active cardiopulmonary disease.  There is no pneumothorax. Electronically Signed   By: Elmer Picker M.D.   On: 11/13/2021 15:36    Procedures Procedures    Medications Ordered in ED Medications - No data to display  ED Course/ Medical Decision Making/ A&P                           Medical Decision Making Amount and/or Complexity of Data Reviewed Labs: ordered. Radiology: ordered.   This patient presents to the ED for concern of lightheadedness, this involves a number of treatment options, and is a complaint that carries with it a high risk of complications and morbidity. A differential diagnosis was considered for the patient's symptoms which is discussed below:   Arrhythmia, hypoglycemia, hypoglycemia, anemia The patient has a history of SVT and has a palpable heart rate of approximately 150 SVT on differential.   Co morbidities: Discussed in HPI   Brief History:  Patient is a 41 year old female with a past medical history significant for SVT and hypothyroidism on thyroid medication  She is presenting to the emergency room today with episode of shortness of breath and chest tightness that began at approximately 12:30 PM.  She states she has a history of SVT and it felt similar to this in the past.  This is her fourth episode of SVT.  She follows with cardiology and was given a pill in the pocket Cardizem which she took several minutes after symptoms began.  She arrived in the emergency room approximately 145 and was brought to triage.   Patient denies any chest pain or leg swelling.  She denies any recreational drug use or alcohol use or withdrawal.  She states she feels primarily lightheaded and short of breath.    During my initial evaluation I coached patient to take slow deep breaths and then attempted Valsalva maneuver.  After that she  continue to take deep breaths states she started to feel better.  Was placed on EKG machine as monitor and appears to be in NSR.  EKG obtained which shows sinus tachycardia with a rate of 106.   Patient had attempted some vagal maneuvers prior to arrival which were unsuccessful.  She had taken a dose of oral Cardizem and I wonder if this decreases threshold for her cardioverting.    EMR reviewed including pt PMHx, past surgical history and past visits to ER.   See HPI for more details  Lab Tests:   I ordered and independently interpreted labs. Labs notable for magnesium within normal limits CBC unremarkable BMP apart from mildly low bicarb of 18 likely due to hyperventilation for greater than 1 hour.  I requested a urine pregnancy test patient states that there is no chance she could be pregnant.  She would prefer to hold off on this as she would like to be discharged home.   Imaging Studies:  NAD. I personally reviewed all imaging studies and no acute abnormality found. I agree with radiology interpretation.    Cardiac Monitoring:  NA EKG non-ischemic NSR   Medicines ordered:  No medications ordered.  Patient's symptoms improved  Critical Interventions:     Consults/Attending Physician   I discussed this case with my attending physician who cosigned this note including patient's presenting symptoms, physical exam, and planned diagnostics and interventions. Attending physician stated agreement with plan or made changes to plan which were implemented.   Reevaluation:  After the interventions noted above I re-evaluated patient and found that they have :resolved   Social Determinants of Health:      Problem List / ED Course:  Likely SVT.  Now completely resolved episode of lightheadedness, tachycardia with rates--per Apple Watch--as high as 220.  She took her pill in the pocket Cardizem and cardioverted in triage with me perhaps due to Valsalva.  Her symptoms  have completely resolved.  I reevaluated patient and recheck vital signs her heart rate is now within normal limits blood pressure normal.  Discharged home.  Discussed case with managing physician who is in agreement with plan.  She has pill in pocket Cardizem.  I recommend low threshold for her to use this.  Hydrate and hold off on caffeine.  She understands no recreational drug use and does not use   Dispostion:  After consideration of the diagnostic results and the patients response to treatment, I feel that the patent would benefit from close outpatient cardiology follow-up.  She states she has an appointment already scheduled for Wednesday   Final Clinical Impression(s) / ED Diagnoses Final diagnoses:  SVT (supraventricular tachycardia)    Rx / DC Orders ED Discharge Orders     None         Gailen Shelter, [place] 11/13/21 2045    Glendora Score, MD 11/15/21 [id number]

## 2021-11-13 NOTE — ED Provider Triage Note (Signed)
Emergency Medicine Provider Triage Evaluation Note  Kareema Keitt , a 41 y.o. female  was evaluated in triage.  Pt complains of palpitations around 12:30p states she has a hx of SVT and felt short of breath and pulse got to 202 by watch. Felt light headed and weak.   She states she took cardizem around 12:30 and when she arrived at ER she was breathing very hard.   This is the 4th episode she's had of (likely) SVT.   Review of Systems  Positive: Palpitations, LH Negative: Fever, CP  Physical Exam  There were no vitals taken for this visit. Gen:   Awake, tachypneic, eyes closed, hyperventilating and shaking but answering questions.  Resp:  Lungs clear MSK:   Moves extremities without difficulty  Other:  Legs symmetric  Medical Decision Making  Medically screening exam initiated at 1:47 PM.  Appropriate orders placed.  Maudie Shingledecker was informed that the remainder of the evaluation will be completed by another provider, this initial triage assessment does not replace that evaluation, and the importance of remaining in the ED until their evaluation is complete.  Pt had sudden improvement with some bearing down in triage room.  EKG after is NSR. Labs checked here.  No CP.    Pati Gallo [place], [place] 11/13/21 1354

## 2021-11-18 ENCOUNTER — Encounter: Payer: Self-pay | Admitting: Internal Medicine

## 2021-11-18 ENCOUNTER — Ambulatory Visit: Payer: BC Managed Care – PPO | Attending: Internal Medicine | Admitting: Internal Medicine

## 2021-11-18 VITALS — BP 100/62 | HR 86 | Ht 67.0 in | Wt 131.0 lb

## 2021-11-18 DIAGNOSIS — E039 Hypothyroidism, unspecified: Secondary | ICD-10-CM | POA: Diagnosis not present

## 2021-11-18 DIAGNOSIS — F909 Attention-deficit hyperactivity disorder, unspecified type: Secondary | ICD-10-CM | POA: Diagnosis not present

## 2021-11-18 DIAGNOSIS — I471 Supraventricular tachycardia, unspecified: Secondary | ICD-10-CM

## 2021-11-18 MED ORDER — METOPROLOL TARTRATE 25 MG PO TABS: 12.5000 mg | ORAL_TABLET | Freq: Two times a day (BID) | ORAL | 3 refills | Status: DC

## 2021-11-18 NOTE — Patient Instructions (Addendum)
Medication Instructions:  Your physician has recommended you make the following change in your medication:   1) START metoprolol tartrate (Lopressor) 12.5mg  twice daily  *If you need a refill on your cardiac medications before your next appointment, please call your pharmacy*  Lab Work: NONE  Testing/Procedures: Your physician has requested that you have an echocardiogram. Echocardiography is a painless test that uses sound waves to create images of your heart. It provides your doctor with information about the size and shape of your heart and how well your heart's chambers and valves are working. This procedure takes approximately one hour. There are no restrictions for this procedure. Please do NOT wear cologne, perfume, aftershave, or lotions (deodorant is allowed). Please arrive 15 minutes prior to your appointment time.  Your physician has requested that you have an exercise tolerance test. For further information please visit [email or web address]. Please also follow instruction sheet, as given.   Follow-Up: At [facility], you and your health needs are our priority.  As part of our continuing mission to provide you with exceptional heart care, we have created designated Provider Care Teams.  These Care Teams include your primary Cardiologist (physician) and Advanced Practice Providers (APPs -  Physician Assistants and Nurse Practitioners) who all work together to provide you with the care you need, when you need it.  Your next appointment:   3 month(s)  The format for your next appointment:   In Person  Provider:   Werner Lean, MD  or Robbie Lis, PA-C, Nicholes Rough, PA-C, Ambrose Pancoast, NP, Christen Bame, NP, or Richardson Dopp, PA-C      Other Instructions Instructions for Exercise Tolerance Test (Stress Test) Please arrive 15 minutes prior to your appointment time for registration and insurance purposes.  The test will take approximately 45 minutes to  complete.  How to prepare for your Exercise Stress Test: Do bring a list of your current medications with you.  If not listed below, you may take your medications as normal. Do wear comfortable clothes (no dresses or overalls) and walking shoes, tennis shoes preferred (no heels or open toed shoes are allowed) Do Not wear cologne, perfume, aftershave or lotions (deodorant is allowed). Please report to [place], [address] for your test.  If these instructions are not followed, your test will have to be rescheduled.  If you have questions or concerns about your appointment, you can call the Stress Lab at [phone].  If you cannot keep your appointment, please provide 24 hours notification to the Stress Lab, to avoid a possible $50 charge to your account.  Important Information About Sugar

## 2021-11-18 NOTE — Progress Notes (Signed)
Cardiology Office Note:    Date:  11/18/2021   ID:  April Parsons, DOB 1980/10/09, MRN [id number]  PCP:  Johna Roles, PA   [facility] Providers Cardiologist:  Werner Lean, MD     Referring MD: Johna Roles, PA   CC:  Follow up SVT  History of Present Illness:    April Parsons is a 41 y.o. female with a hx of SVT 09/25/20 (see scan), ADHD on stable medication, hypothyroidism who presents for follow up from 09/25/20 tachycardia. 2022: rare diltiazem use.  Patient notes that she is doing well .   Is having long SVT runs this fall.  There are no interval hospital/ED visit.   No change in her thyroid dose.  Unclear SVT trigger.  No change in diet, supplements, illness.  No change in her addreral.   No chest pain or pressure .  No SOB/DOE and no PND/Orthopnea.  No weight gain or leg swelling.    Past Medical History:  Diagnosis Date   Anemia     No past surgical history on file.  Current Medications: Current Meds  Medication Sig   amphetamine-dextroamphetamine (ADDERALL XR) 15 MG 24 hr capsule Take 15 mg by mouth every morning.   cetirizine (ZYRTEC) 10 MG tablet    diltiazem (CARDIZEM) 30 MG tablet Take 1 tablet (30 mg total) by mouth every 6 (six) hours as needed.   metoprolol tartrate (LOPRESSOR) 25 MG tablet Take 0.5 tablets (12.5 mg total) by mouth 2 (two) times daily.   thyroid (ARMOUR) 30 MG tablet Take 30 mg by mouth daily.     Allergies:   Patient has no active allergies.   Social History   Socioeconomic History   Marital status: Married    Spouse name: [place] Carbo   Number of children: 0   Years of education: Not on file   Highest education level: Not on file  Occupational History   Not on file  Tobacco Use   Smoking status: Never   Smokeless tobacco: Never  Vaping Use   Vaping Use: Never used  Substance and Sexual Activity   Alcohol use: No    Alcohol/week: 0.0 standard drinks of alcohol   Drug use: No   Sexual activity:  Yes  Other Topics Concern   Not on file  Social History Narrative   Not on file   Social Determinants of Health   Financial Resource Strain: Not on file  Food Insecurity: Not on file  Transportation Needs: Not on file  Physical Activity: Not on file  Stress: Not on file  Social Connections: Not on file    Social: Art therapist, married; 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 Grandmother had atrial filbrllation.  ROS:   Please see the history of present illness.     All other systems reviewed and are negative.  EKGs/Labs/Other Studies Reviewed:    The following studies were reviewed today:  EKG:   11/16/21: sinus tachycardia; no clear delta wave 10/13/20: SR rate 80 WNL 09/25/20: SVT rate 229 with ST depressions  Recent Labs: 11/13/2021: BUN 12; Creatinine, Ser 0.91; Hemoglobin 12.9; Magnesium 2.0; Platelets 334; Potassium 3.6; Sodium 141  Recent Lipid Panel No results found for: "CHOL", "TRIG", "HDL", "CHOLHDL", "VLDL", "LDLCALC", "LDLDIRECT"   Physical Exam:    VS:  BP 100/62   Pulse 86   Ht _0  (1.702 m)   Wt 131 lb (59.4 kg)   SpO2 99%  BMI 20.52 kg/m     Wt Readings from Last 3 Encounters:  11/18/21 131 lb (59.4 kg)  10/13/20 132 lb (59.9 kg)  09/25/20 130 lb (59 kg)    GEN:  Well nourished, well developed in no acute distress HEENT: Normal NECK: No JVD LYMPHATICS: No lymphadenopathy CARDIAC: RRR, no murmurs, rubs, gallops RESPIRATORY:  Clear to auscultation without rales, wheezing or rhonchi  ABDOMEN: Soft, non-tender, non-distended MUSCULOSKELETAL:  No edema; No deformity  SKIN: Warm and dry NEUROLOGIC:  Alert and oriented x 3 PSYCHIATRIC:  Normal affect   ASSESSMENT:    1. SVT (supraventricular tachycardia)   2. Hypothyroidism, unspecified type   3. Attention deficit hyperactivity disorder (ADHD), unspecified ADHD type     PLAN:    SVT  Hypothyroidism ADHD - will get  Echo and POET - will start standing low dose metoprolol 12.5 mg PO BID - has PRN diltiazem 30 mg PO q6hr PRN  - discussed heart monitoring and echo; will defer unless repeat SVT - discussed pros and cons of endocrinology referall  3-4 months me or APP: Based on testing will get flecainide start and f/u with EP instead         Medication Adjustments/Labs and Tests Ordered: Current medicines are reviewed at length with the patient today.  Concerns regarding medicines are outlined above.  Orders Placed This Encounter  Procedures   Cardiac Stress Test: Informed Consent Details: Physician/Practitioner Attestation; Transcribe to consent form and obtain patient signature   EXERCISE TOLERANCE TEST (ETT)   ECHOCARDIOGRAM COMPLETE    Meds ordered this encounter  Medications   metoprolol tartrate (LOPRESSOR) 25 MG tablet    Sig: Take 0.5 tablets (12.5 mg total) by mouth 2 (two) times daily.    Dispense:  90 tablet    Refill:  3     Patient Instructions  Medication Instructions:  Your physician has recommended you make the following change in your medication:   1) START metoprolol tartrate (Lopressor) 12.2m twice daily  *If you need a refill on your cardiac medications before your next appointment, please call your pharmacy*  Lab Work: NONE  Testing/Procedures: Your physician has requested that you have an echocardiogram. Echocardiography is a painless test that uses sound waves to create images of your heart. It provides your doctor with information about the size and shape of your heart and how well your heart's chambers and valves are working. This procedure takes approximately one hour. There are no restrictions for this procedure. Please do NOT wear cologne, perfume, aftershave, or lotions (deodorant is allowed). Please arrive 15 minutes prior to your appointment time.  Your physician has requested that you have an exercise tolerance test. For further information please visit  [email or web address] Please also follow instruction sheet, as given.   Follow-Up: [facility] and your [facility] needs are our [facility].  As part of our continuing mission to provide you with exceptional heart care, we have created designated Provider Care Teams.  These Care Teams include your primary Cardiologist (physician) and Advanced Practice Providers (APPs -  Physician Assistants and Nurse Practitioners) who all work together to provide you with the care you need, when you need it.  Your next appointment:   3 month(s)  The format for your next appointment:   In Person  Provider:   MWerner Lean MD  or VRobbie Lis PA-C, TNicholes Rough PA-C, EAmbrose Pancoast NP, MChristen Bame NP, or SRichardson Dopp PA-C      Other Instructions Instructions  for Exercise Tolerance Test (Stress Test) Please arrive 15 minutes prior to your appointment time for registration and insurance purposes.  The test will take approximately 45 minutes to complete.  How to prepare for your Exercise Stress Test: Do bring a list of your current medications with you.  If not listed below, you may take your medications as normal. Do wear comfortable clothes (no dresses or overalls) and walking shoes, tennis shoes preferred (no heels or open toed shoes are allowed) Do Not wear cologne, perfume, aftershave or lotions (deodorant is allowed). Please report to [place], [address] for your test.  If these instructions are not followed, your test will have to be rescheduled.  If you have questions or concerns about your appointment, you can call the Stress Lab at [phone].  If you cannot keep your appointment, please provide 24 hours notification to the Stress Lab, to avoid a possible $50 charge to your account.  Important Information About Sugar         Signed, Werner Lean, MD  11/18/2021 10:13 AM    [facility]

## 2021-11-26 DIAGNOSIS — Z23 Encounter for immunization: Secondary | ICD-10-CM | POA: Diagnosis not present

## 2021-11-26 DIAGNOSIS — Z0000 Encounter for general adult medical examination without abnormal findings: Secondary | ICD-10-CM | POA: Diagnosis not present

## 2021-11-26 DIAGNOSIS — Z124 Encounter for screening for malignant neoplasm of cervix: Secondary | ICD-10-CM | POA: Diagnosis not present

## 2021-12-03 ENCOUNTER — Ambulatory Visit (HOSPITAL_COMMUNITY): Payer: BC Managed Care – PPO | Attending: Internal Medicine

## 2021-12-03 ENCOUNTER — Ambulatory Visit: Payer: BC Managed Care – PPO | Attending: Internal Medicine

## 2021-12-03 DIAGNOSIS — I471 Supraventricular tachycardia, unspecified: Secondary | ICD-10-CM | POA: Diagnosis not present

## 2021-12-03 LAB — ECHOCARDIOGRAM COMPLETE
Area-P 1/2: 4.11 cm2
S' Lateral: 3.2 cm

## 2021-12-04 LAB — EXERCISE TOLERANCE TEST
Angina Index: 0
Base ST Depression (mm): 0.5 mm
Duke Treadmill Score: 11
Estimated workload: 13.4
Exercise duration (min): 11 min
Exercise duration (sec): 0 s
MPHR: 180 {beats}/min
Peak HR: 173 {beats}/min
Percent HR: 96 %
RPE: 15
Rest HR: 76 {beats}/min
ST Depression (mm): 0 mm

## 2022-01-21 ENCOUNTER — Other Ambulatory Visit: Payer: Self-pay | Admitting: Physician Assistant

## 2022-01-21 DIAGNOSIS — F900 Attention-deficit hyperactivity disorder, predominantly inattentive type: Secondary | ICD-10-CM | POA: Diagnosis not present

## 2022-01-21 DIAGNOSIS — Z1231 Encounter for screening mammogram for malignant neoplasm of breast: Secondary | ICD-10-CM

## 2022-02-05 ENCOUNTER — Ambulatory Visit
Admission: RE | Admit: 2022-02-05 | Discharge: 2022-02-05 | Disposition: A | Discharge status: Home / Self Care | Payer: BC Managed Care – PPO | Source: Ambulatory Visit | Attending: Physician Assistant | Admitting: Physician Assistant

## 2022-02-05 DIAGNOSIS — Z1231 Encounter for screening mammogram for malignant neoplasm of breast: Secondary | ICD-10-CM

## 2022-02-19 ENCOUNTER — Ambulatory Visit: Payer: BC Managed Care – PPO | Admitting: Internal Medicine

## 2022-02-25 ENCOUNTER — Other Ambulatory Visit: Payer: Self-pay | Admitting: Physician Assistant

## 2022-02-25 DIAGNOSIS — N644 Mastodynia: Secondary | ICD-10-CM

## 2022-03-04 NOTE — Progress Notes (Signed)
Cardiology Office Note:    Date:  03/05/2022   ID:  April Parsons, DOB 1980-01-29, MRN [id number]  PCP:  Johna Roles, PA   [facility] Providers Cardiologist:  Werner Lean, MD     Referring MD: Johna Roles, PA   CC:  Follow up SVT  History of Present Illness:    April Parsons is a 42 y.o. female with a hx of SVT 09/25/20 (see scan), ADHD on stable medication, hypothyroidism who presents for follow up from 09/25/20 tachycardia. 2022: rare diltiazem use. 2023: had repeat SVT. Echo normal,   Patient notes that she is doing well.   Since last visit notes no further spells . There are no interval hospital/ED visit.    No chest pain or pressure .  No SOB/DOE and no PND/Orthopnea.  No weight gain or leg swelling.  No palpitations or syncope.  She did not find diltiazem helpful with arrhythmias.   Past Medical History:  Diagnosis Date   Anemia     No past surgical history on file.  Current Medications: Current Meds  Medication Sig   amphetamine-dextroamphetamine (ADDERALL XR) 15 MG 24 hr capsule Take 15 mg by mouth every morning.   cetirizine (ZYRTEC) 10 MG tablet    diltiazem (CARDIZEM) 30 MG tablet Take 1 tablet (30 mg total) by mouth every 6 (six) hours as needed.   flecainide (TAMBOCOR) 100 MG tablet Take 2 tablets (200 mg total) by mouth daily as needed (SVT).   metoprolol tartrate (LOPRESSOR) 25 MG tablet Take 0.5 tablets (12.5 mg total) by mouth 2 (two) times daily.   thyroid (ARMOUR) 30 MG tablet Take 30 mg by mouth daily.     Allergies:   Patient has no active allergies.   Social History   Socioeconomic History   Marital status: Married    Spouse name: West Carbo   Number of children: 0   Years of education: Not on file   Highest education level: Not on file  Occupational History   Not on file  Tobacco Use   Smoking status: Never   Smokeless tobacco: Never  Vaping Use   Vaping Use: Never used  Substance and Sexual Activity    Alcohol use: No    Alcohol/week: 0.0 standard drinks of alcohol   Drug use: No   Sexual activity: Yes  Other Topics Concern   Not on file  Social History Narrative   Not on file   Social Determinants of Health   Financial Resource Strain: Not on file  Food Insecurity: Not on file  Transportation Needs: Not on file  Physical Activity: Not on file  Stress: Not on file  Social Connections: Not on file    Social: Art therapist, married; 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 Grandmother had atrial filbrllation.  ROS:   Please see the history of present illness.     All other systems reviewed and are negative.  EKGs/Labs/Other Studies Reviewed:    The following studies were reviewed today:  EKG:   11/16/21: sinus tachycardia; no clear delta wave 10/13/20: SR rate 80 WNL 09/25/20: SVT rate 229 with ST depressions  Cardiac Studies & Procedures     STRESS TESTS  EXERCISE TOLERANCE TEST (ETT) 12/04/2021  Narrative   No additional ST deviation was noted.  Nonspecific ST segment changes present at rest, do not worsen during exercise. Normal ECG stress test.   ECHOCARDIOGRAM  ECHOCARDIOGRAM COMPLETE 12/03/2021  Narrative  ECHOCARDIOGRAM REPORT    Patient Name:   Sundus Holtzclaw Date of Exam: 12/03/2021 Medical Rec #:  [id number]        Height:       67.0 in Accession #:    [id number]       Weight:       131.0 lb Date of Birth:  [birth year]       BSA:          1.689 m Patient Age:    57 years         BP:           100/62 mmHg Patient Gender: F                HR:           69 bpm. Exam Location:  [address]  Procedure: 2D Echo, 3D Echo, Cardiac Doppler and Color Doppler  Indications:    I47.10 SVT  History:        Patient has no prior history of Echocardiogram examinations. Arrythmias:SVT. Hypothyroidism.  Sonographer:    Deliah Boston RDCS Referring Phys: [facility]  Kathy Wares  IMPRESSIONS   1. Left ventricular ejection fraction, by estimation, is 60 to 65%. The left ventricle has normal function. The left ventricle has no regional wall motion abnormalities. Left ventricular diastolic parameters were normal. 2. Right ventricular systolic function is normal. The right ventricular size is normal. There is normal pulmonary artery systolic pressure. The estimated right ventricular systolic pressure is Q000111Q mmHg. 3. The mitral valve is normal in structure. Trivial mitral valve regurgitation. No evidence of mitral stenosis. 4. The aortic valve is tricuspid. Aortic valve regurgitation is not visualized. No aortic stenosis is present. 5. The inferior vena cava is dilated in size with >50% respiratory variability, suggesting right atrial pressure of 8 mmHg.  FINDINGS Left Ventricle: Left ventricular ejection fraction, by estimation, is 60 to 65%. The left ventricle has normal function. The left ventricle has no regional wall motion abnormalities. 3D left ventricular ejection fraction analysis performed but not reported based on interpreter judgement due to suboptimal tracking. The left ventricular internal cavity size was normal in size. There is no left ventricular hypertrophy. Left ventricular diastolic parameters were normal.  Right Ventricle: The right ventricular size is normal. No increase in right ventricular wall thickness. Right ventricular systolic function is normal. There is normal pulmonary artery systolic pressure. The tricuspid regurgitant velocity is 2.12 m/s, and with an assumed right atrial pressure of 10 mmHg, the estimated right ventricular systolic pressure is Q000111Q mmHg.  Left Atrium: Left atrial size was normal in size.  Right Atrium: Right atrial size was normal in size.  Pericardium: There is no evidence of pericardial effusion.  Mitral Valve: The mitral valve is normal in structure. Trivial mitral valve regurgitation. No evidence of  mitral valve stenosis.  Tricuspid Valve: The tricuspid valve is normal in structure. Tricuspid valve regurgitation is trivial. No evidence of tricuspid stenosis.  Aortic Valve: The aortic valve is tricuspid. Aortic valve regurgitation is not visualized. No aortic stenosis is present.  Pulmonic Valve: The pulmonic valve was normal in structure. Pulmonic valve regurgitation is trivial. No evidence of pulmonic stenosis.  Aorta: The aortic root is normal in size and structure.  Venous: The inferior vena cava is dilated in size with greater than 50% respiratory variability, suggesting right atrial pressure of 8 mmHg.  IAS/Shunts: No atrial level shunt detected by color flow Doppler.   LEFT VENTRICLE PLAX 2D LVIDd:  4.40 cm   Diastology LVIDs:         3.20 cm   LV e' medial:    13.35 cm/s LV PW:         0.80 cm   LV E/e' medial:  6.2 LV IVS:        0.70 cm   LV e' lateral:   19.60 cm/s LVOT diam:     2.20 cm   LV E/e' lateral: 4.2 LV SV:         70 LV SV Index:   41 LVOT Area:     3.80 cm  3D Volume EF: LV EDV:       126 ml LV ESV:       35 ml LV SV:        90 ml  RIGHT VENTRICLE RV Basal diam:  2.80 cm RV S prime:     16.80 cm/s TAPSE (M-mode): 2.6 cm  LEFT ATRIUM             Index        RIGHT ATRIUM           Index LA diam:        3.10 cm 1.83 cm/m   RA Area:     10.90 cm LA Vol (A2C):   41.9 ml 24.80 ml/m  RA Volume:   23.50 ml  13.91 ml/m LA Vol (A4C):   40.4 ml 23.91 ml/m LA Biplane Vol: 42.1 ml 24.92 ml/m AORTIC VALVE LVOT Vmax:   89.80 cm/s LVOT Vmean:  57.267 cm/s LVOT VTI:    0.184 m  AORTA Ao Root diam: 2.70 cm Ao Asc diam:  2.80 cm  MITRAL VALVE               TRICUSPID VALVE MV Area (PHT)  cm         TR Peak grad:   18.0 mmHg MV Decel Time: 185 msec    TR Vmax:        212.00 cm/s MV E velocity: 82.80 cm/s MV A velocity: 55.80 cm/s  SHUNTS MV E/A ratio:  1.48        Systemic VTI:  0.18 m Systemic Diam: 2.20 cm  Cherlynn Kaiser  MD Electronically signed by Cherlynn Kaiser MD Signature Date/Time: 12/03/2021/5:23:20 PM    Final              Recent Labs: 11/13/2021: BUN 12; Creatinine, Ser 0.91; Hemoglobin 12.9; Magnesium 2.0; Platelets 334; Potassium 3.6; Sodium 141  Recent Lipid Panel No results found for: "CHOL", "TRIG", "HDL", "CHOLHDL", "VLDL", "LDLCALC", "LDLDIRECT"   Physical Exam:    VS:  BP 98/60   Pulse 88   Ht 5' 6"$  (1.676 m)   Wt 125 lb 6.4 oz (56.9 kg)   SpO2 99%   BMI 20.24 kg/m     Wt Readings from Last 3 Encounters:  03/05/22 125 lb 6.4 oz (56.9 kg)  11/18/21 131 lb (59.4 kg)  10/13/20 132 lb (59.9 kg)    GEN:  Well nourished, well developed in no acute distress HEENT: Normal NECK: No JVD LYMPHATICS: No lymphadenopathy CARDIAC: RRR, no murmurs, rubs, gallops RESPIRATORY:  Clear to auscultation without rales, wheezing or rhonchi  ABDOMEN: Soft, non-tender, non-distended MUSCULOSKELETAL:  No edema; No deformity  SKIN: Warm and dry NEUROLOGIC:  Alert and oriented x 3 PSYCHIATRIC:  Normal affect   ASSESSMENT:    1. SVT (supraventricular tachycardia)   2. Hypothyroidism, unspecified type   3. Attention deficit hyperactivity  disorder (ADHD), unspecified ADHD type     PLAN:    SVT  (I suspect atrial tachycardia Hypothyroidism ADHD - continue low dose BB - has PRN diltiazem  Pill in the pocket approach for Flecainide - negative stress test and structurally normal echo - . we will have the patient take a diltiazem or a beta blocker 30 minutes or more  to prevent a rapid ventricular rate - if no resolution, we will have take a pill in the pocket flecainide (not to exceed one dose per 24 hours) - patient is < 70 kg, dose will be 200 mg  If worsening send to EP for AT ablation eval One year with mne    Medication Adjustments/Labs and Tests Ordered: Current medicines are reviewed at length with the patient today.  Concerns regarding medicines are outlined above.  No  orders of the defined types were placed in this encounter.   Meds ordered this encounter  Medications   flecainide (TAMBOCOR) 100 MG tablet    Sig: Take 2 tablets (200 mg total) by mouth daily as needed (SVT).    Dispense:  12 tablet    Refill:  0     Patient Instructions  Medication Instructions:  Your physician has recommended you make the following change in your medication:  START: flecainide 200 mg by mouth once daily as needed for SVT  When you have SVT take diltiazem 1st- if after 30 min you are still having SVT take Flecainide 200 mg    *If you need a refill on your cardiac medications before your next appointment, please call your pharmacy*   Lab Work: NONE If you have labs (blood work) drawn today and your tests are completely normal, you will receive your results only by: MyChart Message (if you have MyChart) OR A paper copy in the mail If you have any lab test that is abnormal or we need to change your treatment, we will call you to review the results.   Testing/Procedures: NONE   Follow-Up: At [facility], you and your health needs are our priority.  As part of our continuing mission to provide you with exceptional heart care, we have created designated Provider Care Teams.  These Care Teams include your primary Cardiologist (physician) and Advanced Practice Providers (APPs -  Physician Assistants and Nurse Practitioners) who all work together to provide you with the care you need, when you need it.   Your next appointment:   1 year(s)  Provider:   Werner Lean, MD      Signed, Werner Lean, MD  03/05/2022 8:18 AM    [place]

## 2022-03-05 ENCOUNTER — Ambulatory Visit: Payer: BC Managed Care – PPO | Attending: Internal Medicine | Admitting: Internal Medicine

## 2022-03-05 ENCOUNTER — Encounter: Payer: Self-pay | Admitting: Internal Medicine

## 2022-03-05 VITALS — BP 98/60 | HR 88 | Ht 66.0 in | Wt 125.4 lb

## 2022-03-05 DIAGNOSIS — F909 Attention-deficit hyperactivity disorder, unspecified type: Secondary | ICD-10-CM

## 2022-03-05 DIAGNOSIS — E039 Hypothyroidism, unspecified: Secondary | ICD-10-CM | POA: Diagnosis not present

## 2022-03-05 DIAGNOSIS — I471 Supraventricular tachycardia, unspecified: Secondary | ICD-10-CM

## 2022-03-05 MED ORDER — FLECAINIDE ACETATE 100 MG PO TABS: 200.0000 mg | ORAL_TABLET | Freq: Every day | ORAL | 0 refills | Status: AC | PRN

## 2022-03-05 NOTE — Patient Instructions (Signed)
Medication Instructions:  Your physician has recommended you make the following change in your medication:  START: flecainide 200 mg by mouth once daily as needed for SVT  When you have SVT take diltiazem 1st- if after 30 min you are still having SVT take Flecainide 200 mg    *If you need a refill on your cardiac medications before your next appointment, please call your pharmacy*   Lab Work: NONE If you have labs (blood work) drawn today and your tests are completely normal, you will receive your results only by: MyChart Message (if you have MyChart) OR A paper copy in the mail If you have any lab test that is abnormal or we need to change your treatment, we will call you to review the results.   Testing/Procedures: NONE   Follow-Up: At [facility], you and your health needs are our priority.  As part of our continuing mission to provide you with exceptional heart care, we have created designated Provider Care Teams.  These Care Teams include your primary Cardiologist (physician) and Advanced Practice Providers (APPs -  Physician Assistants and Nurse Practitioners) who all work together to provide you with the care you need, when you need it.   Your next appointment:   1 year(s)  Provider:   Werner Lean, MD

## 2022-03-12 ENCOUNTER — Ambulatory Visit
Admission: RE | Admit: 2022-03-12 | Discharge: 2022-03-12 | Disposition: A | Discharge status: Home / Self Care | Payer: BC Managed Care – PPO | Source: Ambulatory Visit | Attending: Physician Assistant | Admitting: Physician Assistant

## 2022-03-12 DIAGNOSIS — N6001 Solitary cyst of right breast: Secondary | ICD-10-CM | POA: Diagnosis not present

## 2022-03-12 DIAGNOSIS — N644 Mastodynia: Secondary | ICD-10-CM | POA: Diagnosis not present

## 2022-03-19 IMAGING — MG DIGITAL DIAGNOSTIC BILAT W/ TOMO W/ CAD
8 of 15 series · 8 of 40 positions shown · non-contrast
Comparison: None.

CLINICAL DATA: 39-year-old female with intermittent, bilateral
milky discharge when expressed. The patient has noticed a few
episodes of light brown discharge from the left breast, which has
since improved.



[L MLO synth-2D (1 of 3)]
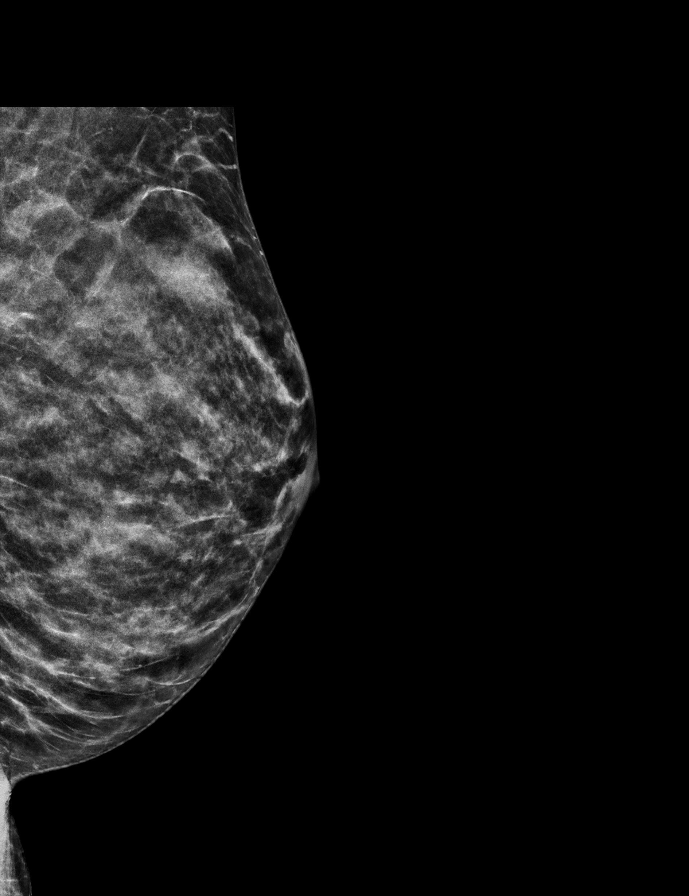

[L CC synth-2D (1 of 2)]
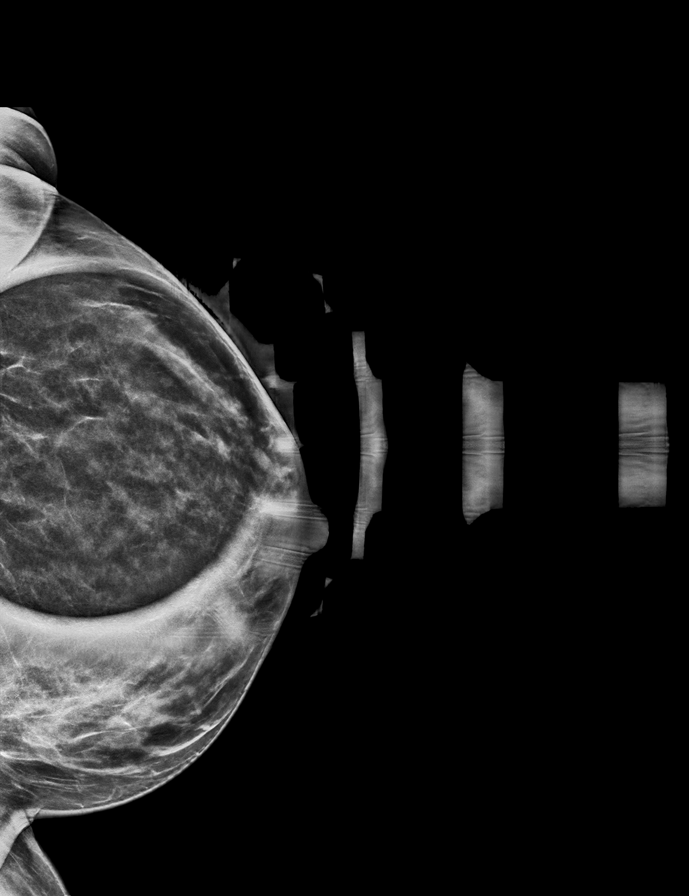

[L MLO synth-2D (2 of 3)]
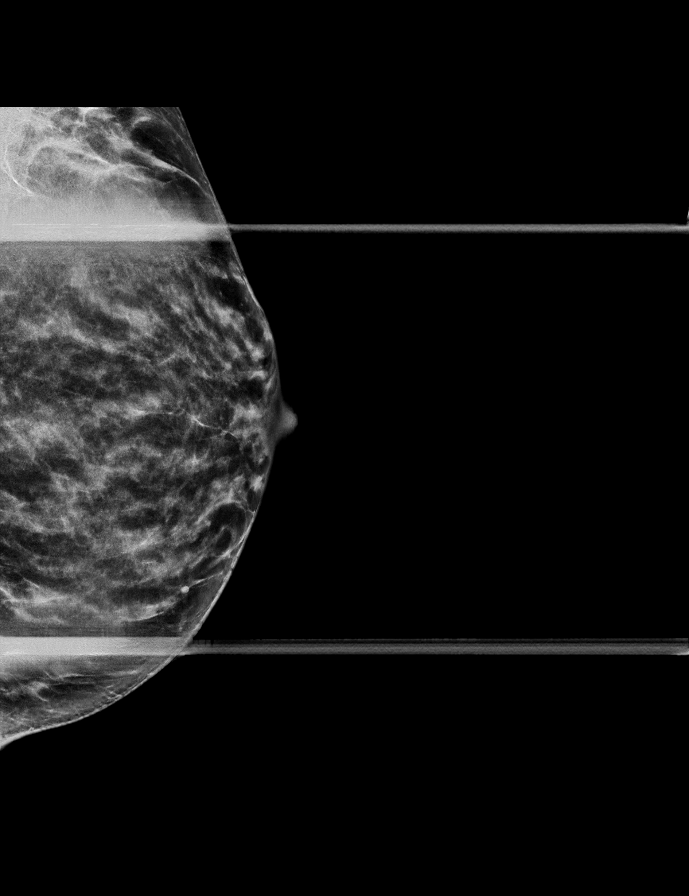

[L MLO synth-2D (3 of 3)]
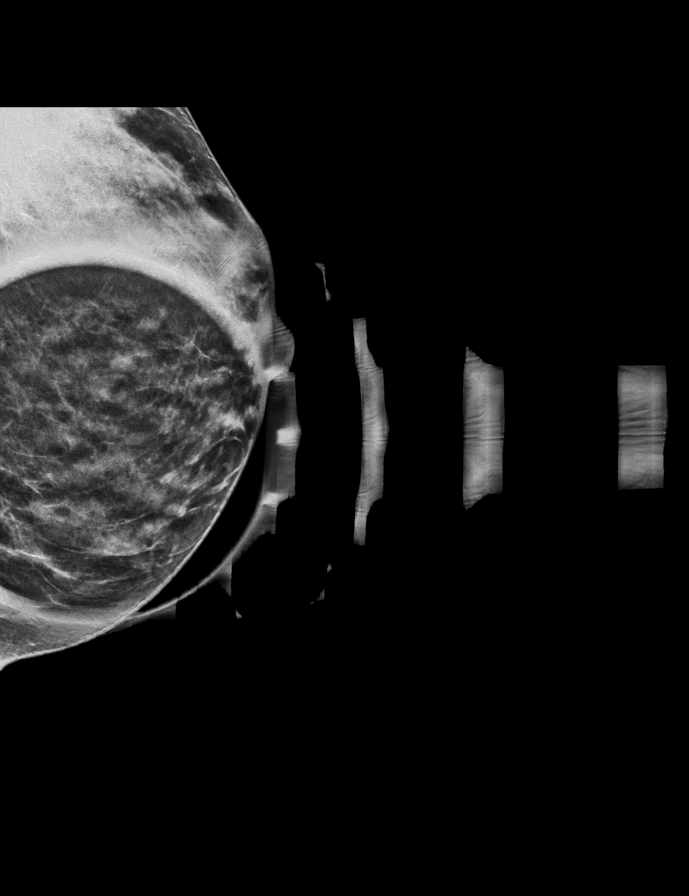

[R CC synth-2D]
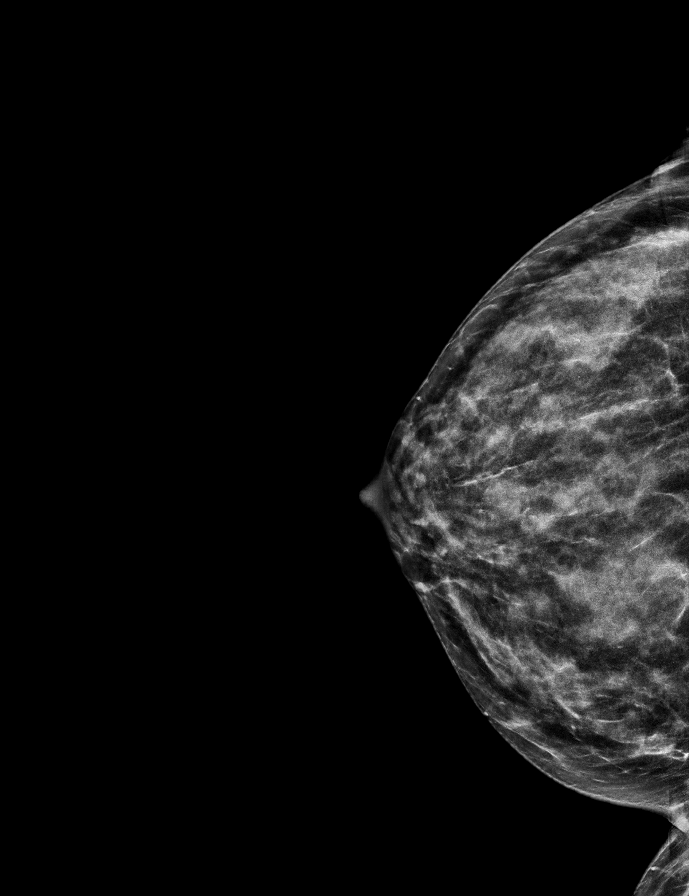

[R MLO synth-2D]
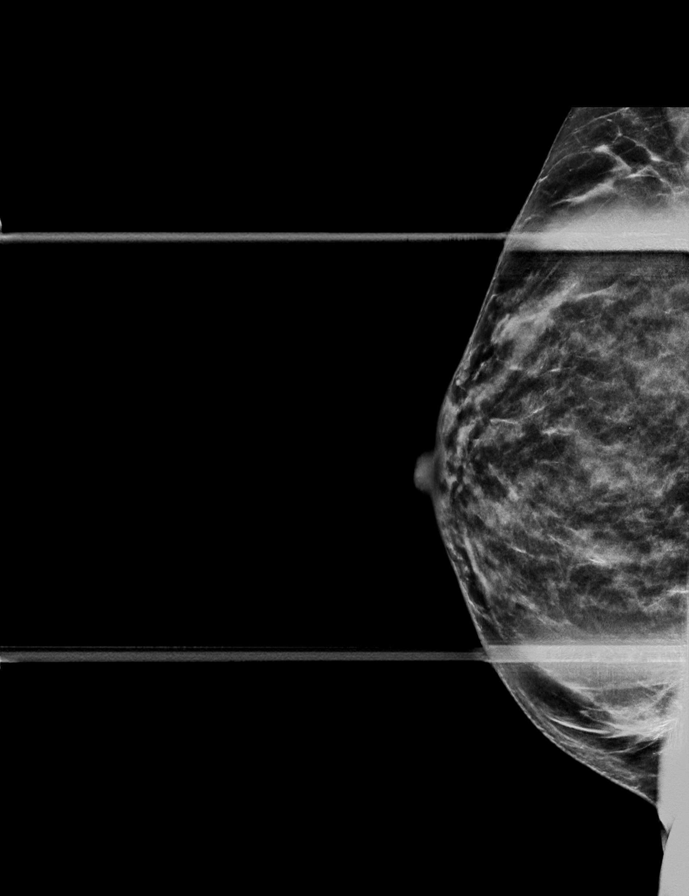

[L CC synth-2D (2 of 2)]
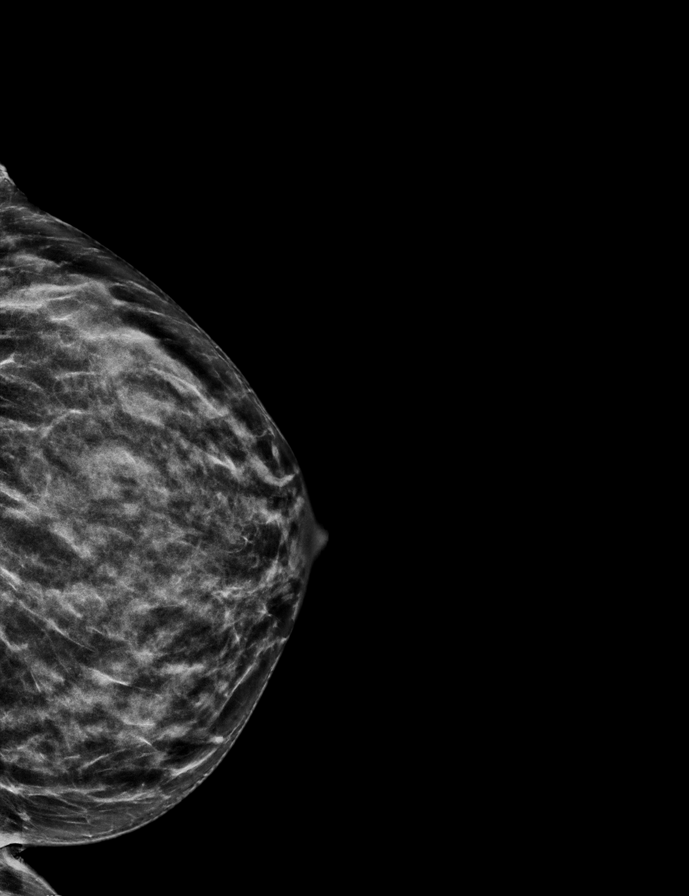

[L MLO tomo · tomo slice 36/52.0]
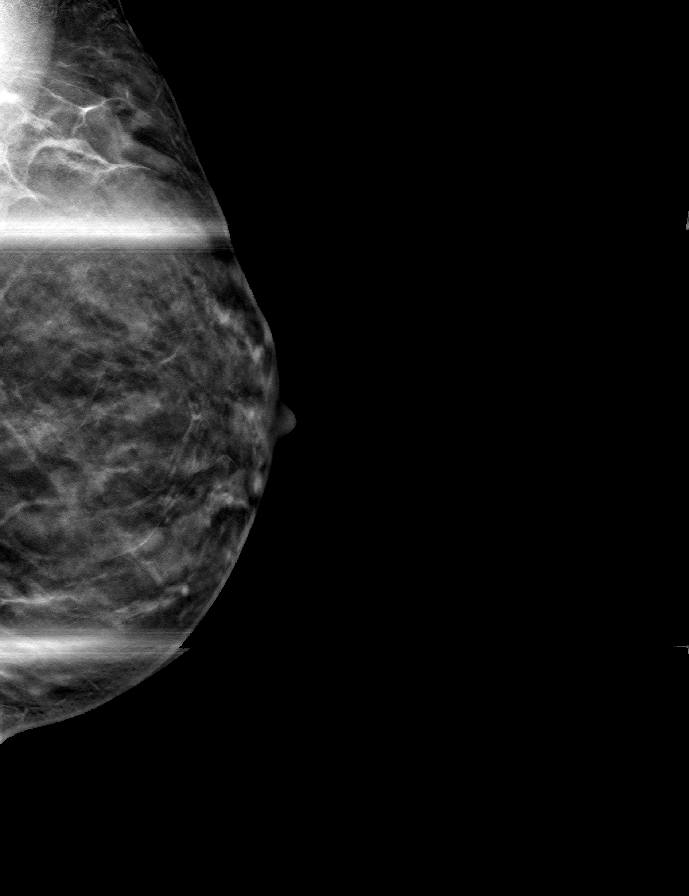

[8 of 40 positions shown; findings below may reference images not displayed]

ACR Breast Density Category d: The breast tissue is extremely dense,
which lowers the sensitivity of mammography.
FINDINGS: There is an oval, circumscribed equal density mass in the lateral
left breast at mid depth. Otherwise no suspicious findings in either
breast.

On physical exam, the patient is able to elicit milky discharge from
multiple ducts bilaterally when expressed.

Targeted ultrasound is performed, showing a few scattered mildly
dilated ducts behind the bilateral nipples. Additional note is made
of an oval, circumscribed anechoic cyst at the [DATE] position 3 cm
from the nipple on the left. It measures 1.8 x 1.7 x 0.7 cm. This
corresponds with the mammographic finding.
IMPRESSION: 1. No mammographic or sonographic evidence of malignancy in either
breast.
2. Benign left breast simple cyst.

RECOMMENDATION:
1. Causes of nipple discharge include: Hormonal changes, fibrocystic
changes, benign papilloma, abscess/mastitis, birth control pills,
endocrine disorders, injury/trauma to breast, duct ectasia,
medications, prolactinoma, and breast cancer. As is evident from
this list, nipple discharge often stems from a benign condition,
however, breast cancer is a possibility when unilateral spontaneous
persistent single duct discharge (especially bloody or clear
discharge) is present.
2.  Screening mammogram in one year.(Code:FD-7-OSX)

I have discussed the findings and recommendations with the patient.
If applicable, a reminder letter will be sent to the patient
regarding the next appointment.

BI-RADS CATEGORY  2: Benign.

## 2022-03-19 IMAGING — US US BREAST*R* LIMITED INC AXILLA
1 series · 4 of 4 positions shown · non-contrast
Comparison: None.

CLINICAL DATA: 39-year-old female with intermittent, bilateral
milky discharge when expressed. The patient has noticed a few
episodes of light brown discharge from the left breast, which has
since improved.



[Series 1: us breast*right* limited inc axilla · 0.06mm/px · 4 of 4 slices shown]
[im 1/4]
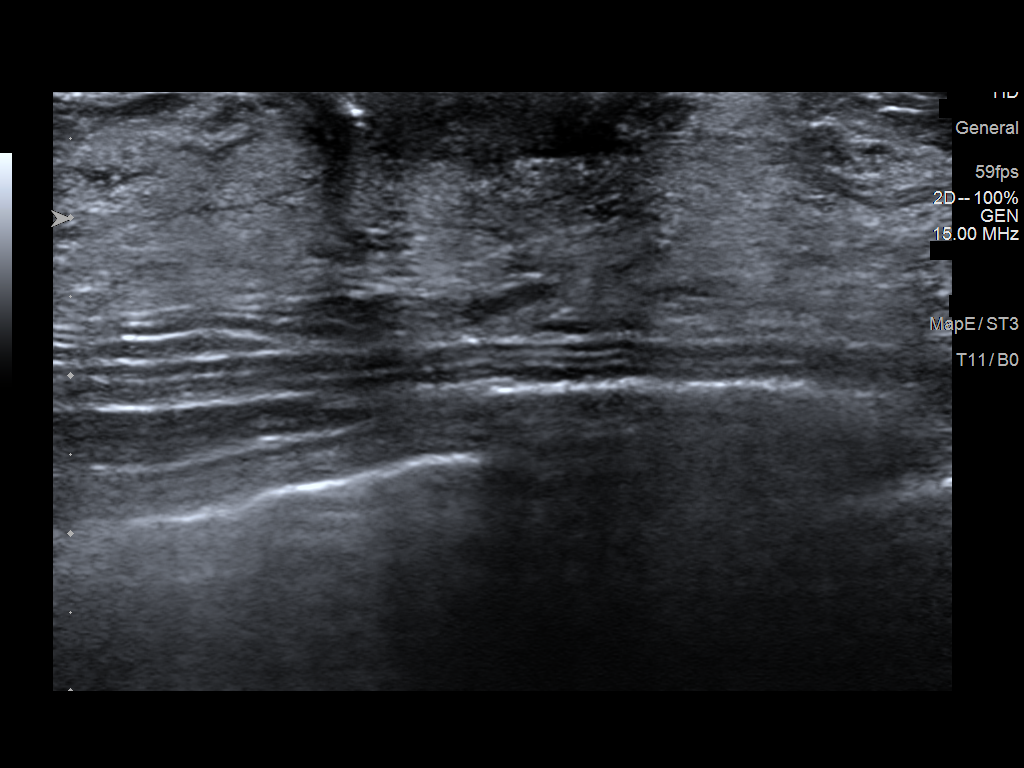
[im 2/4]
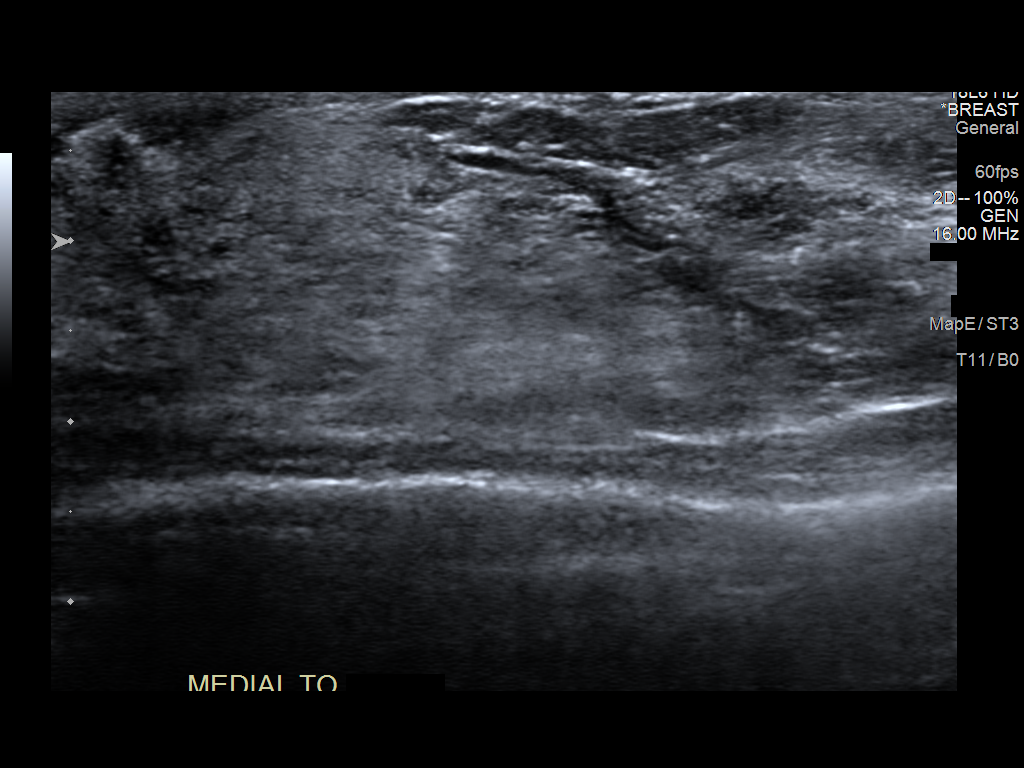
[im 3/4]
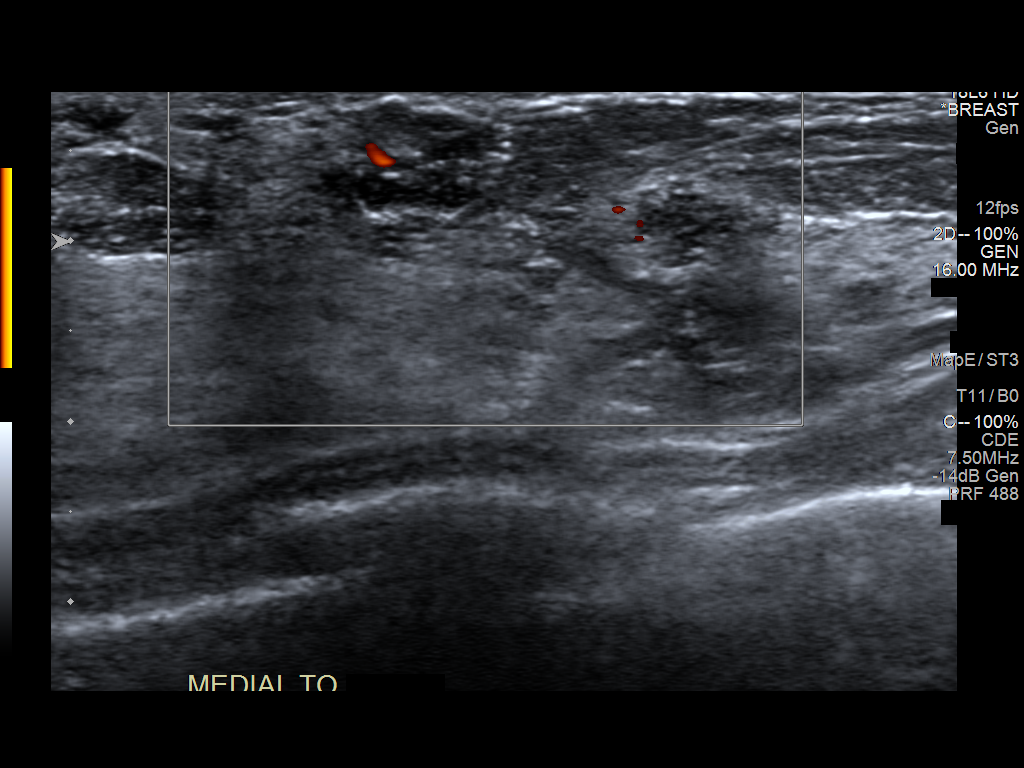
[im 4/4]
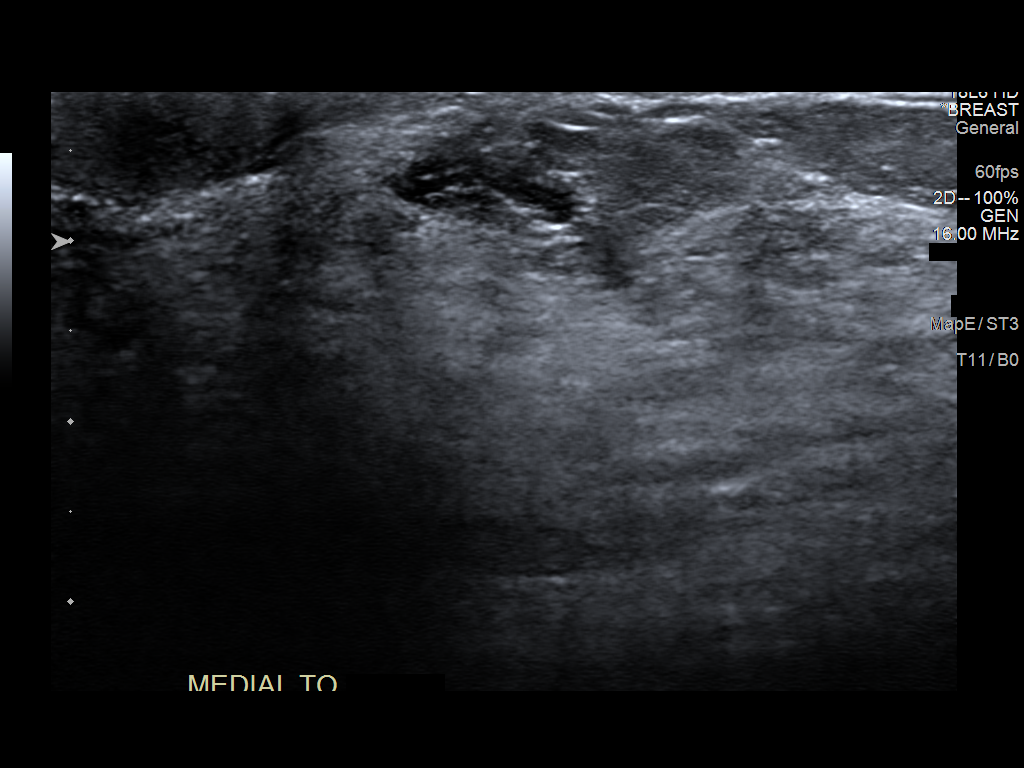

[4 of 4 positions shown; findings below may reference images not displayed]

ACR Breast Density Category d: The breast tissue is extremely dense,
which lowers the sensitivity of mammography.
FINDINGS: There is an oval, circumscribed equal density mass in the lateral
left breast at mid depth. Otherwise no suspicious findings in either
breast.

On physical exam, the patient is able to elicit milky discharge from
multiple ducts bilaterally when expressed.

Targeted ultrasound is performed, showing a few scattered mildly
dilated ducts behind the bilateral nipples. Additional note is made
of an oval, circumscribed anechoic cyst at the [DATE] position 3 cm
from the nipple on the left. It measures 1.8 x 1.7 x 0.7 cm. This
corresponds with the mammographic finding.
IMPRESSION: 1. No mammographic or sonographic evidence of malignancy in either
breast.
2. Benign left breast simple cyst.

RECOMMENDATION:
1. Causes of nipple discharge include: Hormonal changes, fibrocystic
changes, benign papilloma, abscess/mastitis, birth control pills,
endocrine disorders, injury/trauma to breast, duct ectasia,
medications, prolactinoma, and breast cancer. As is evident from
this list, nipple discharge often stems from a benign condition,
however, breast cancer is a possibility when unilateral spontaneous
persistent single duct discharge (especially bloody or clear
discharge) is present.
2.  Screening mammogram in one year.(Code:FD-7-OSX)

I have discussed the findings and recommendations with the patient.
If applicable, a reminder letter will be sent to the patient
regarding the next appointment.

BI-RADS CATEGORY  2: Benign.

## 2022-04-22 DIAGNOSIS — F900 Attention-deficit hyperactivity disorder, predominantly inattentive type: Secondary | ICD-10-CM | POA: Diagnosis not present

## 2022-04-29 DIAGNOSIS — F4323 Adjustment disorder with mixed anxiety and depressed mood: Secondary | ICD-10-CM | POA: Diagnosis not present

## 2022-05-07 DIAGNOSIS — F4323 Adjustment disorder with mixed anxiety and depressed mood: Secondary | ICD-10-CM | POA: Diagnosis not present

## 2022-05-17 DIAGNOSIS — F4323 Adjustment disorder with mixed anxiety and depressed mood: Secondary | ICD-10-CM | POA: Diagnosis not present

## 2022-05-18 DIAGNOSIS — F900 Attention-deficit hyperactivity disorder, predominantly inattentive type: Secondary | ICD-10-CM | POA: Diagnosis not present

## 2022-05-21 DIAGNOSIS — F4323 Adjustment disorder with mixed anxiety and depressed mood: Secondary | ICD-10-CM | POA: Diagnosis not present

## 2022-05-28 DIAGNOSIS — F4323 Adjustment disorder with mixed anxiety and depressed mood: Secondary | ICD-10-CM | POA: Diagnosis not present

## 2022-06-01 ENCOUNTER — Other Ambulatory Visit: Payer: Self-pay

## 2022-06-01 ENCOUNTER — Emergency Department (HOSPITAL_COMMUNITY)
Admission: EM | Admit: 2022-06-01 | Discharge: 2022-06-01 | Disposition: A | Discharge status: Home / Self Care | Payer: BC Managed Care – PPO | Attending: Emergency Medicine | Admitting: Emergency Medicine

## 2022-06-01 ENCOUNTER — Telehealth: Payer: Self-pay | Admitting: Internal Medicine

## 2022-06-01 ENCOUNTER — Emergency Department (HOSPITAL_COMMUNITY): Payer: BC Managed Care – PPO

## 2022-06-01 DIAGNOSIS — R000 Tachycardia, unspecified: Secondary | ICD-10-CM | POA: Insufficient documentation

## 2022-06-01 DIAGNOSIS — Z79899 Other long term (current) drug therapy: Secondary | ICD-10-CM | POA: Diagnosis not present

## 2022-06-01 DIAGNOSIS — R079 Chest pain, unspecified: Secondary | ICD-10-CM | POA: Diagnosis not present

## 2022-06-01 DIAGNOSIS — R0789 Other chest pain: Secondary | ICD-10-CM | POA: Diagnosis not present

## 2022-06-01 LAB — BASIC METABOLIC PANEL
Anion gap: 7 (ref 5–15)
BUN: 10 mg/dL (ref 6–20)
CO2: 27 mmol/L (ref 22–32)
Calcium: 9.1 mg/dL (ref 8.9–10.3)
Chloride: 104 mmol/L (ref 98–111)
Creatinine, Ser: 0.78 mg/dL (ref 0.44–1.00)
GFR, Estimated: >60 mL/min (ref >60)
Glucose, Bld: 78 mg/dL (ref 70–99)
Potassium: 3.3 mmol/L — ABNORMAL LOW (ref 3.5–5.1)
Sodium: 138 mmol/L (ref 135–145)

## 2022-06-01 LAB — I-STAT BETA HCG BLOOD, ED (MC, WL, AP ONLY): I-stat hCG, quantitative: <5 m[IU]/mL (ref <5)

## 2022-06-01 LAB — CBC
HCT: 37.3 % (ref 36.0–46.0)
Hemoglobin: 12 g/dL (ref 12.0–15.0)
MCH: 30.8 pg (ref 26.0–34.0)
MCHC: 32.2 g/dL (ref 30.0–36.0)
MCV: 95.6 fL (ref 80.0–100.0)
Platelets: 267 10*3/uL (ref 150–400)
RBC: 3.9 MIL/uL (ref 3.87–5.11)
RDW: 12.6 % (ref 11.5–15.5)
WBC: 7.2 10*3/uL (ref 4.0–10.5)
nRBC: 0 % (ref 0.0–0.2)

## 2022-06-01 LAB — TROPONIN I (HIGH SENSITIVITY)
Troponin I (High Sensitivity): <2 ng/L (ref <18)
Troponin I (High Sensitivity): <2 ng/L (ref <18)

## 2022-06-01 LAB — TSH: TSH: 2.035 u[IU]/mL (ref 0.350–4.500)

## 2022-06-01 LAB — MAGNESIUM: Magnesium: 2.2 mg/dL (ref 1.7–2.4)

## 2022-06-01 MED ORDER — POTASSIUM CHLORIDE CRYS ER 20 MEQ PO TBCR
40.0000 meq | EXTENDED_RELEASE_TABLET | Freq: Once | ORAL | Status: AC
  Administered 2022-06-01: 40 meq via ORAL
  Filled 2022-06-01: qty 2

## 2022-06-01 NOTE — ED Triage Notes (Signed)
Pt reports onset of middle CP radiating to her back that started earlier today with SOB and dizziness. Pt states that she has hx of SVT and felt like she may have been in it earlier today, although her HR reached maximum of 125 per pt. Pt took PRN medication for this and felt some relief.

## 2022-06-01 NOTE — Telephone Encounter (Signed)
  Per MyChart Scheduling message:  Pt c/o of Chest Pain: STAT if CP now or developed within 24 hours  1. Are you having CP right now?   2. Are you experiencing any other symptoms (ex. SOB, nausea, vomiting, sweating)?   3. How long have you been experiencing CP?   4. Is your CP continuous or coming and going?   5. Have you taken Nitroglycerin?     Yes having pain right now and most of the day yesterday into late last night.   Mild shortness of breath, mild lightheadedness   I have noticed chest pains on and off since my last visit, but never stuck around like this. What I notice now is I have chest pain even when I get my heart rate back to a normal range.    I have not taken Nitroglycerin unless it was in any of the medications he prescribed me previously.  ?

## 2022-06-01 NOTE — Telephone Encounter (Signed)
Called pt to follow up on symptoms.  Last BP 131/80-91 HR up to 125 at times.  Describes symptoms as chest pressure, feels like something is sitting on chest, has mild SOB and pain at shoulder blades.  Denies sweating nausea.  Advised pt to go to the ED for evaluation.  Pt is agreeable to plan.

## 2022-06-01 NOTE — Discharge Instructions (Signed)
The testing done today in the emergency department is reassuring.  Follow-up in the cardiology office soon as possible.  Return the emergency department for any new or worsening symptoms of concern.

## 2022-06-01 NOTE — Telephone Encounter (Signed)
  Per MyChart Scheduling message:   Pt c/o of Chest Pain: STAT if CP now or developed within 24 hours   1. Are you having CP right now?    2. Are you experiencing any other symptoms (ex. SOB, nausea, vomiting, sweating)?    3. How long have you been experiencing CP?    4. Is your CP continuous or coming and going?    5. Have you taken Nitroglycerin?       Yes having pain right now and most of the day yesterday into late last night.   Mild shortness of breath, mild lightheadedness   I have noticed chest pains on and off since my last visit, but never stuck around like this. What I notice now is I have chest pain even when I get my heart rate back to a normal range.    I have not taken Nitroglycerin unless it was in any of the medications he prescribed me previously.  ?     Spoke to the pt, she mentioned having active chest pain since yesterday. Pt stated " my chest feels restricted and it feels like something is sitting on my chest". BP this AM 131/85. She does have mild shortness of breath. Advised pt to immediately seek medical attention or call 911. Pt stated the ED will not do anything for her and send her home. Again explained ED precautions and pt voiced understanding. Will forward to MD and nurse.

## 2022-06-01 NOTE — ED Provider Notes (Signed)
[employer] EMERGENCY DEPARTMENT AT [facility] Provider Note   CSN: [id number] Arrival date & time: 06/01/22  1458     History  Chief Complaint  Patient presents with   Chest Pain    April Parsons is a 42 y.o. female.   Chest Pain Patient presents for her chest pain.  Medical history includes prior episodes of SVT, anemia.  Yesterday, she was in her normal state of health.  In the afternoon, while at rest, she experienced some chest discomfort.  On her Apple Watch, she was noted to have elevated heart rate.  Patient is followed by cardiology and has as needed medications at home due to her prior episodes of SVT.  She took a dose of diltiazem.  After 30 minutes, she took a dose of flecainide.  Despite improvement in her heart rate, she continued to have some ache in her chest.  This persisted throughout the night.  This morning, heart rate was again elevated and she took another dose of diltiazem and flecainide.  Heart rate is since normalized.  She has a very mild central chest discomfort.  It is not worse with deep inspiration.  It is not improved by leaning forward.  She contacted her cardiology office and they told her to come to the ED.  Other than her mild chest discomfort, patient denies any other current symptoms.     Home Medications Prior to Admission medications   Medication Sig Start Date End Date Taking? Authorizing Provider  amphetamine-dextroamphetamine (ADDERALL XR) 15 MG 24 hr capsule Take 15 mg by mouth every morning. 08/30/20   [provider]  cetirizine (ZYRTEC) 10 MG tablet     [provider]  diltiazem (CARDIZEM) 30 MG tablet Take 1 tablet (30 mg total) by mouth every 6 (six) hours as needed. 09/25/20 03/05/22  Maury Dus, MD  flecainide (TAMBOCOR) 100 MG tablet Take 2 tablets (200 mg total) by mouth daily as needed (SVT). 03/05/22   Riley Lam A, MD  metoprolol tartrate (LOPRESSOR) 25 MG tablet Take 0.5 tablets (12.5 mg  total) by mouth 2 (two) times daily. 11/18/21   Christell Constant, MD  thyroid (ARMOUR) 30 MG tablet Take 30 mg by mouth daily.    [provider]      Allergies    Patient has no known allergies.    Review of Systems   Review of Systems  Cardiovascular:  Positive for chest pain.  All other systems reviewed and are negative.   Physical Exam Updated Vital Signs BP 127/82 (BP Location: Right Arm)   Pulse 99   Temp 98.5 F (36.9 C) (Oral)   Resp 16   SpO2 100%  Physical Exam Vitals and nursing note reviewed.  Constitutional:      General: She is not in acute distress.    Appearance: She is well-developed. She is not ill-appearing, toxic-appearing or diaphoretic.  HENT:     Head: Normocephalic and atraumatic.  Eyes:     Conjunctiva/sclera: Conjunctivae normal.  Cardiovascular:     Rate and Rhythm: Normal rate and regular rhythm.     Heart sounds: No murmur heard. Pulmonary:     Effort: Pulmonary effort is normal. No tachypnea or respiratory distress.     Breath sounds: Normal breath sounds.  Abdominal:     Palpations: Abdomen is soft.     Tenderness: There is no abdominal tenderness.  Musculoskeletal:        General: No swelling. Normal range of motion.  Cervical back: Normal range of motion and neck supple.     Right lower leg: No edema.     Left lower leg: No edema.  Skin:    General: Skin is warm and dry.     Coloration: Skin is not cyanotic or pale.  Neurological:     General: No focal deficit present.     Mental Status: She is alert and oriented to person, place, and time.  Psychiatric:        Mood and Affect: Mood normal.        Behavior: Behavior normal.     ED Results / Procedures / Treatments   Labs (all labs ordered are listed, but only abnormal results are displayed) Labs Reviewed  BASIC METABOLIC PANEL - Abnormal; Notable for the following components:      Result Value   Potassium 3.3 (*)    All other components within normal  limits  CBC  TSH  MAGNESIUM  I-STAT BETA HCG BLOOD, ED ([facility], [facility], [facility] ONLY)  TROPONIN I (HIGH SENSITIVITY)  TROPONIN I (HIGH SENSITIVITY)    EKG EKG Interpretation  Date/Time:  Tuesday Jun 01 2022 15:07:48 EDT Ventricular Rate:  90 PR Interval:  132 QRS Duration: 98 QT Interval:  338 QTC Calculation: 413 R Axis:   80 Text Interpretation: Normal sinus rhythm Confirmed by Gloris Manchester ([id number]) on 06/01/2022 5:35:07 PM  Radiology DG Chest 2 View  Result Date: 06/01/2022 CLINICAL DATA:  Chest pain EXAM: CHEST - 2 VIEW COMPARISON:  11/13/2021 FINDINGS: The heart size and mediastinal contours are within normal limits. Both lungs are clear. The visualized skeletal structures are unremarkable. IMPRESSION: Normal chest radiographs. Electronically Signed   By: Duanne Guess D.O.   On: 06/01/2022 15:34    Procedures Procedures    Medications Ordered in ED Medications  potassium chloride SA (KLOR-CON M) CR tablet 40 mEq (40 mEq Oral Given 06/01/22 1758)    ED Course/ Medical Decision Making/ A&P                             Medical Decision Making Amount and/or Complexity of Data Reviewed Labs: ordered. Radiology: ordered.  Risk Prescription drug management.   This patient presents to the ED for concern of chest pain, this involves an extensive number of treatment options, and is a complaint that carries with it a high risk of complications and morbidity.  The differential diagnosis includes arrhythmia, pericarditis, ACS, anxiety, GERD   Co morbidities that complicate the patient evaluation  SVT, anemia   Additional history obtained:  Additional history obtained from N/A External records from outside source obtained and reviewed including EMR   Lab Tests:  I Ordered, and personally interpreted labs.  The pertinent results include: Mild hypokalemia is present with otherwise normal electrolytes.  Hemoglobin is normal.  No leukocytosis is present.  Troponin is undetectable x  2   Imaging Studies ordered:  I ordered imaging studies including chest x-ray I independently visualized and interpreted imaging which showed no acute findings I agree with the radiologist interpretation   Cardiac Monitoring: / EKG:  The patient was maintained on a cardiac monitor.  I personally viewed and interpreted the cardiac monitored which showed an underlying rhythm of: [place] rhythm  Problem List / ED Course / Critical interventions / Medication management  Patient is a healthy 42 year old female with history of prior episodes of SVT, presents for episodes of tachycardia and chest discomfort since yesterday.  Prior to being bedded in the ED, diagnostic workup was initiated.  Initial lab work is reassuring with undetectable troponin level.  A mild hypokalemia is present and replacement potassium was ordered.  On assessment, patient is well-appearing.  Current heart rate is a normal sinus rhythm.  I reviewed data on her Apple Watch app.  It seems the patient was in a regular rhythm during prior episodes.  Patient was placed on bedside cardiac monitor here in the ED. repeat troponin was undetectable.  Patient remained in normal sinus rhythm with normal heart rate while in the ED.  She may benefit from cardiac monitoring.  Referral for outpatient cardiology follow-up was ordered.  Patient was discharged in good condition. I ordered medication including potassium chloride for hypokalemia Reevaluation of the patient after these medicines showed that the patient stayed the same I have reviewed the patients home medicines and have made adjustments as needed   Social Determinants of Health:  Has access to outpatient care        Final Clinical Impression(s) / ED Diagnoses Final diagnoses:  Chest pain, unspecified type  Tachycardia    Rx / DC Orders ED Discharge Orders          Ordered    Ambulatory referral to Cardiology       Comments: If you have not heard from the  Cardiology office within the next 72 hours please call [phone].   06/01/22 1919              Gloris Manchester, MD 06/01/22 1920

## 2022-06-03 ENCOUNTER — Other Ambulatory Visit: Payer: Self-pay | Admitting: Physician Assistant

## 2022-06-03 DIAGNOSIS — R0781 Pleurodynia: Secondary | ICD-10-CM | POA: Diagnosis not present

## 2022-06-03 DIAGNOSIS — R1011 Right upper quadrant pain: Secondary | ICD-10-CM | POA: Diagnosis not present

## 2022-06-03 DIAGNOSIS — I4719 Other supraventricular tachycardia: Secondary | ICD-10-CM | POA: Diagnosis not present

## 2022-06-03 DIAGNOSIS — R0789 Other chest pain: Secondary | ICD-10-CM

## 2022-06-03 DIAGNOSIS — F4323 Adjustment disorder with mixed anxiety and depressed mood: Secondary | ICD-10-CM | POA: Diagnosis not present

## 2022-06-03 DIAGNOSIS — I471 Supraventricular tachycardia, unspecified: Secondary | ICD-10-CM

## 2022-06-07 NOTE — Telephone Encounter (Signed)
Called pt advised of MD recommendation: Had Benign ED visit. She was not out of rhythm during her episode of chest pain: ED reveiwed telemetry and her Apple Watch. Would recommend stress Echo to rule out ischemia.  This may be non cardiac chest pain.        Pt reports had an echo and stress test 12/03/21 wants to know if needs to have stress echocardiogram.  Also, reports PCP has ordered a heart monitor and a CT scan.  Advised will send to MD to advise.

## 2022-06-07 NOTE — Telephone Encounter (Signed)
Pt gave a verbal ok to leave a detailed message.  Pt is at work.   Left a message advised of MD response: Heart monitor is reasonable.  I am not clear what the CT is for (Cardiac CT vs non cardiac).   I would be happy to have her follow up with our team after these tests are performed if she is still having chest discomfort   Advised if she has further questions or concerns to call back.

## 2022-06-16 DIAGNOSIS — F4323 Adjustment disorder with mixed anxiety and depressed mood: Secondary | ICD-10-CM | POA: Diagnosis not present

## 2022-06-25 DIAGNOSIS — F4323 Adjustment disorder with mixed anxiety and depressed mood: Secondary | ICD-10-CM | POA: Diagnosis not present

## 2022-06-25 DIAGNOSIS — I4719 Other supraventricular tachycardia: Secondary | ICD-10-CM | POA: Diagnosis not present

## 2022-07-01 DIAGNOSIS — I471 Supraventricular tachycardia, unspecified: Secondary | ICD-10-CM | POA: Diagnosis not present

## 2022-07-01 DIAGNOSIS — K2930 Chronic superficial gastritis without bleeding: Secondary | ICD-10-CM | POA: Diagnosis not present

## 2022-07-02 DIAGNOSIS — F4323 Adjustment disorder with mixed anxiety and depressed mood: Secondary | ICD-10-CM | POA: Diagnosis not present

## 2022-07-08 DIAGNOSIS — F900 Attention-deficit hyperactivity disorder, predominantly inattentive type: Secondary | ICD-10-CM | POA: Diagnosis not present

## 2022-07-08 DIAGNOSIS — F4323 Adjustment disorder with mixed anxiety and depressed mood: Secondary | ICD-10-CM | POA: Diagnosis not present

## 2022-07-09 ENCOUNTER — Other Ambulatory Visit: Payer: BC Managed Care – PPO

## 2022-07-15 DIAGNOSIS — F4323 Adjustment disorder with mixed anxiety and depressed mood: Secondary | ICD-10-CM | POA: Diagnosis not present

## 2022-07-21 DIAGNOSIS — F4323 Adjustment disorder with mixed anxiety and depressed mood: Secondary | ICD-10-CM | POA: Diagnosis not present

## 2022-07-30 DIAGNOSIS — F4323 Adjustment disorder with mixed anxiety and depressed mood: Secondary | ICD-10-CM | POA: Diagnosis not present

## 2022-08-05 DIAGNOSIS — F4323 Adjustment disorder with mixed anxiety and depressed mood: Secondary | ICD-10-CM | POA: Diagnosis not present

## 2022-08-12 ENCOUNTER — Telehealth: Payer: Self-pay | Admitting: Internal Medicine

## 2022-08-12 DIAGNOSIS — F4323 Adjustment disorder with mixed anxiety and depressed mood: Secondary | ICD-10-CM | POA: Diagnosis not present

## 2022-08-12 NOTE — Telephone Encounter (Signed)
Patient stated she had heart monitor test ordered by her PCP and wants Dr. Izora Ribas to review the results and provide next steps.

## 2022-08-13 NOTE — Telephone Encounter (Signed)
Left a message to call back.

## 2022-08-13 NOTE — Telephone Encounter (Signed)
Pt is returning call.  

## 2022-08-13 NOTE — Telephone Encounter (Signed)
Patient is returning a phone call.  

## 2022-08-13 NOTE — Telephone Encounter (Signed)
Called pt in regards to heart monitor. Reports Endocrinologist ordered a heart monitor pt would like MD to review and advise. MD reviewed results routed to our office reports the following: 67 triggered events, 65 were NSR or ST, 2 were short runs of SVT.  Avg HR 84 pt reports CP, heart racing and fluttering.  Pt does not think symptoms are Cardiac in nature.   Called pt back advised of MD response.  Pt concerned she is/was having symptoms and monitor didn't show abnormality wants to know what it could be.   Pt reports Thyroid labs have been in range with no issues.  Has been on same dose of medication for years.    Last LDL 87 11/23/19. Advised to have labs updated; has a physical scheduled for Nov 2024.  Denies having anxiety but does report increased stress.    MD offered increase in diltiazem if pt would like; however does not think med will help as symptoms do not appear to be heart related.  .Pt does not want to increase diltiazem at this time   Would like to continue to monitor symptoms if they increase pt will call in to schedule OV to be evaluated. All questions answered.

## 2022-08-19 DIAGNOSIS — F900 Attention-deficit hyperactivity disorder, predominantly inattentive type: Secondary | ICD-10-CM | POA: Diagnosis not present

## 2022-08-20 DIAGNOSIS — F4323 Adjustment disorder with mixed anxiety and depressed mood: Secondary | ICD-10-CM | POA: Diagnosis not present

## 2022-08-26 DIAGNOSIS — F4323 Adjustment disorder with mixed anxiety and depressed mood: Secondary | ICD-10-CM | POA: Diagnosis not present

## 2022-09-03 DIAGNOSIS — F4323 Adjustment disorder with mixed anxiety and depressed mood: Secondary | ICD-10-CM | POA: Diagnosis not present

## 2022-09-09 DIAGNOSIS — F4323 Adjustment disorder with mixed anxiety and depressed mood: Secondary | ICD-10-CM | POA: Diagnosis not present

## 2022-09-16 DIAGNOSIS — F4323 Adjustment disorder with mixed anxiety and depressed mood: Secondary | ICD-10-CM | POA: Diagnosis not present

## 2022-09-23 DIAGNOSIS — F4323 Adjustment disorder with mixed anxiety and depressed mood: Secondary | ICD-10-CM | POA: Diagnosis not present

## 2022-09-30 DIAGNOSIS — F4323 Adjustment disorder with mixed anxiety and depressed mood: Secondary | ICD-10-CM | POA: Diagnosis not present

## 2022-10-01 DIAGNOSIS — F900 Attention-deficit hyperactivity disorder, predominantly inattentive type: Secondary | ICD-10-CM | POA: Diagnosis not present

## 2022-10-07 DIAGNOSIS — F4323 Adjustment disorder with mixed anxiety and depressed mood: Secondary | ICD-10-CM | POA: Diagnosis not present

## 2022-10-14 DIAGNOSIS — F4323 Adjustment disorder with mixed anxiety and depressed mood: Secondary | ICD-10-CM | POA: Diagnosis not present

## 2022-10-21 DIAGNOSIS — F4323 Adjustment disorder with mixed anxiety and depressed mood: Secondary | ICD-10-CM | POA: Diagnosis not present

## 2022-10-29 DIAGNOSIS — F4323 Adjustment disorder with mixed anxiety and depressed mood: Secondary | ICD-10-CM | POA: Diagnosis not present

## 2022-11-05 DIAGNOSIS — F4323 Adjustment disorder with mixed anxiety and depressed mood: Secondary | ICD-10-CM | POA: Diagnosis not present

## 2022-11-08 DIAGNOSIS — F4323 Adjustment disorder with mixed anxiety and depressed mood: Secondary | ICD-10-CM | POA: Diagnosis not present

## 2022-11-11 DIAGNOSIS — F4323 Adjustment disorder with mixed anxiety and depressed mood: Secondary | ICD-10-CM | POA: Diagnosis not present

## 2022-11-19 DIAGNOSIS — F4323 Adjustment disorder with mixed anxiety and depressed mood: Secondary | ICD-10-CM | POA: Diagnosis not present

## 2022-11-25 DIAGNOSIS — F4323 Adjustment disorder with mixed anxiety and depressed mood: Secondary | ICD-10-CM | POA: Diagnosis not present

## 2022-12-02 DIAGNOSIS — K2930 Chronic superficial gastritis without bleeding: Secondary | ICD-10-CM | POA: Diagnosis not present

## 2022-12-02 DIAGNOSIS — E039 Hypothyroidism, unspecified: Secondary | ICD-10-CM | POA: Diagnosis not present

## 2022-12-02 DIAGNOSIS — Z0000 Encounter for general adult medical examination without abnormal findings: Secondary | ICD-10-CM | POA: Diagnosis not present

## 2022-12-02 DIAGNOSIS — F4323 Adjustment disorder with mixed anxiety and depressed mood: Secondary | ICD-10-CM | POA: Diagnosis not present

## 2022-12-02 DIAGNOSIS — K259 Gastric ulcer, unspecified as acute or chronic, without hemorrhage or perforation: Secondary | ICD-10-CM | POA: Diagnosis not present

## 2022-12-02 DIAGNOSIS — Z23 Encounter for immunization: Secondary | ICD-10-CM | POA: Diagnosis not present

## 2022-12-02 DIAGNOSIS — I471 Supraventricular tachycardia, unspecified: Secondary | ICD-10-CM | POA: Diagnosis not present

## 2022-12-09 DIAGNOSIS — F4323 Adjustment disorder with mixed anxiety and depressed mood: Secondary | ICD-10-CM | POA: Diagnosis not present

## 2022-12-15 DIAGNOSIS — F4323 Adjustment disorder with mixed anxiety and depressed mood: Secondary | ICD-10-CM | POA: Diagnosis not present

## 2022-12-24 DIAGNOSIS — F4323 Adjustment disorder with mixed anxiety and depressed mood: Secondary | ICD-10-CM | POA: Diagnosis not present

## 2022-12-30 DIAGNOSIS — F4323 Adjustment disorder with mixed anxiety and depressed mood: Secondary | ICD-10-CM | POA: Diagnosis not present

## 2023-01-07 DIAGNOSIS — F4323 Adjustment disorder with mixed anxiety and depressed mood: Secondary | ICD-10-CM | POA: Diagnosis not present

## 2023-01-13 DIAGNOSIS — F4323 Adjustment disorder with mixed anxiety and depressed mood: Secondary | ICD-10-CM | POA: Diagnosis not present

## 2023-01-17 ENCOUNTER — Ambulatory Visit: Payer: BC Managed Care – PPO | Admitting: Podiatry

## 2023-01-17 ENCOUNTER — Encounter: Payer: Self-pay | Admitting: Podiatry

## 2023-01-17 DIAGNOSIS — L600 Ingrowing nail: Secondary | ICD-10-CM | POA: Diagnosis not present

## 2023-01-17 NOTE — Patient Instructions (Signed)

## 2023-01-17 NOTE — Progress Notes (Signed)
Subjective:   Patient ID: April Parsons, female   DOB: 42 y.o.   MRN: [id number]   HPI Patient states the right big toenail is damaged and it has been painful and I want to just go ahead and have it removed as it has been this way for over a year neuro   ROS      Objective:  Physical Exam  Vascular status intact with the patient found to have a thickened loose right big toenail that is painful when pressed and making shoe gear difficult     Assessment:   damaged right hallux nail bed that is painful and she would like removed     Plan:  H&P reviewed and I have recommended removal allowed her to read consent form and she understands this will be a permanent procedure.  I went ahead today and I infiltrated the right big toe 60 mg like Marcaine mixture sterile prep done using sterile instrumentation removed the hallux nail exposed matrix applied phenol 3 applications 30 seconds followed by alcohol lavage sterile dressing gave instructions on soaks wear dressing 24 hours take it off earlier if throbbing were to occur

## 2023-01-21 DIAGNOSIS — F4323 Adjustment disorder with mixed anxiety and depressed mood: Secondary | ICD-10-CM | POA: Diagnosis not present

## 2023-01-27 DIAGNOSIS — F4323 Adjustment disorder with mixed anxiety and depressed mood: Secondary | ICD-10-CM | POA: Diagnosis not present

## 2023-01-28 DIAGNOSIS — R1010 Upper abdominal pain, unspecified: Secondary | ICD-10-CM | POA: Diagnosis not present

## 2023-02-03 DIAGNOSIS — F4323 Adjustment disorder with mixed anxiety and depressed mood: Secondary | ICD-10-CM | POA: Diagnosis not present

## 2023-02-09 ENCOUNTER — Encounter: Payer: Self-pay | Admitting: Podiatry

## 2023-02-10 DIAGNOSIS — F4323 Adjustment disorder with mixed anxiety and depressed mood: Secondary | ICD-10-CM | POA: Diagnosis not present

## 2023-02-17 DIAGNOSIS — F4323 Adjustment disorder with mixed anxiety and depressed mood: Secondary | ICD-10-CM | POA: Diagnosis not present

## 2023-02-24 DIAGNOSIS — F4323 Adjustment disorder with mixed anxiety and depressed mood: Secondary | ICD-10-CM | POA: Diagnosis not present

## 2023-03-02 DIAGNOSIS — F4323 Adjustment disorder with mixed anxiety and depressed mood: Secondary | ICD-10-CM | POA: Diagnosis not present

## 2023-03-07 DIAGNOSIS — F4323 Adjustment disorder with mixed anxiety and depressed mood: Secondary | ICD-10-CM | POA: Diagnosis not present

## 2023-03-10 DIAGNOSIS — F4323 Adjustment disorder with mixed anxiety and depressed mood: Secondary | ICD-10-CM | POA: Diagnosis not present

## 2023-03-14 DIAGNOSIS — F4323 Adjustment disorder with mixed anxiety and depressed mood: Secondary | ICD-10-CM | POA: Diagnosis not present

## 2023-03-18 DIAGNOSIS — F4323 Adjustment disorder with mixed anxiety and depressed mood: Secondary | ICD-10-CM | POA: Diagnosis not present

## 2023-03-21 DIAGNOSIS — F4323 Adjustment disorder with mixed anxiety and depressed mood: Secondary | ICD-10-CM | POA: Diagnosis not present

## 2023-03-24 DIAGNOSIS — F4323 Adjustment disorder with mixed anxiety and depressed mood: Secondary | ICD-10-CM | POA: Diagnosis not present

## 2023-03-31 DIAGNOSIS — N907 Vulvar cyst: Secondary | ICD-10-CM | POA: Diagnosis not present

## 2023-03-31 DIAGNOSIS — N920 Excessive and frequent menstruation with regular cycle: Secondary | ICD-10-CM | POA: Diagnosis not present

## 2023-03-31 DIAGNOSIS — N644 Mastodynia: Secondary | ICD-10-CM | POA: Diagnosis not present

## 2023-03-31 DIAGNOSIS — N6452 Nipple discharge: Secondary | ICD-10-CM | POA: Diagnosis not present

## 2023-03-31 DIAGNOSIS — M25462 Effusion, left knee: Secondary | ICD-10-CM | POA: Diagnosis not present

## 2023-04-01 DIAGNOSIS — F4323 Adjustment disorder with mixed anxiety and depressed mood: Secondary | ICD-10-CM | POA: Diagnosis not present

## 2023-04-04 DIAGNOSIS — F4323 Adjustment disorder with mixed anxiety and depressed mood: Secondary | ICD-10-CM | POA: Diagnosis not present

## 2023-04-07 DIAGNOSIS — F4323 Adjustment disorder with mixed anxiety and depressed mood: Secondary | ICD-10-CM | POA: Diagnosis not present

## 2023-04-14 DIAGNOSIS — F4323 Adjustment disorder with mixed anxiety and depressed mood: Secondary | ICD-10-CM | POA: Diagnosis not present

## 2023-04-15 ENCOUNTER — Other Ambulatory Visit: Payer: Self-pay | Admitting: Physician Assistant

## 2023-04-15 DIAGNOSIS — Z0000 Encounter for general adult medical examination without abnormal findings: Secondary | ICD-10-CM

## 2023-04-18 DIAGNOSIS — F4323 Adjustment disorder with mixed anxiety and depressed mood: Secondary | ICD-10-CM | POA: Diagnosis not present

## 2023-04-19 DIAGNOSIS — F900 Attention-deficit hyperactivity disorder, predominantly inattentive type: Secondary | ICD-10-CM | POA: Diagnosis not present

## 2023-04-21 DIAGNOSIS — F4323 Adjustment disorder with mixed anxiety and depressed mood: Secondary | ICD-10-CM | POA: Diagnosis not present

## 2023-04-25 DIAGNOSIS — F4323 Adjustment disorder with mixed anxiety and depressed mood: Secondary | ICD-10-CM | POA: Diagnosis not present

## 2023-04-28 ENCOUNTER — Ambulatory Visit
Admission: RE | Admit: 2023-04-28 | Discharge: 2023-04-28 | Discharge status: Home / Self Care | Source: Ambulatory Visit | Attending: Physician Assistant

## 2023-04-28 DIAGNOSIS — F4323 Adjustment disorder with mixed anxiety and depressed mood: Secondary | ICD-10-CM | POA: Diagnosis not present

## 2023-04-28 DIAGNOSIS — Z0000 Encounter for general adult medical examination without abnormal findings: Secondary | ICD-10-CM

## 2023-04-28 DIAGNOSIS — Z1231 Encounter for screening mammogram for malignant neoplasm of breast: Secondary | ICD-10-CM | POA: Diagnosis not present

## 2023-05-05 DIAGNOSIS — F4323 Adjustment disorder with mixed anxiety and depressed mood: Secondary | ICD-10-CM | POA: Diagnosis not present

## 2023-05-13 DIAGNOSIS — F4323 Adjustment disorder with mixed anxiety and depressed mood: Secondary | ICD-10-CM | POA: Diagnosis not present

## 2023-05-16 DIAGNOSIS — F4323 Adjustment disorder with mixed anxiety and depressed mood: Secondary | ICD-10-CM | POA: Diagnosis not present

## 2023-05-20 DIAGNOSIS — F4323 Adjustment disorder with mixed anxiety and depressed mood: Secondary | ICD-10-CM | POA: Diagnosis not present

## 2023-05-23 DIAGNOSIS — F4323 Adjustment disorder with mixed anxiety and depressed mood: Secondary | ICD-10-CM | POA: Diagnosis not present

## 2023-05-27 DIAGNOSIS — F4323 Adjustment disorder with mixed anxiety and depressed mood: Secondary | ICD-10-CM | POA: Diagnosis not present

## 2023-05-30 DIAGNOSIS — F4323 Adjustment disorder with mixed anxiety and depressed mood: Secondary | ICD-10-CM | POA: Diagnosis not present

## 2023-06-02 DIAGNOSIS — F4323 Adjustment disorder with mixed anxiety and depressed mood: Secondary | ICD-10-CM | POA: Diagnosis not present

## 2023-06-06 DIAGNOSIS — F4323 Adjustment disorder with mixed anxiety and depressed mood: Secondary | ICD-10-CM | POA: Diagnosis not present

## 2023-06-09 DIAGNOSIS — F4323 Adjustment disorder with mixed anxiety and depressed mood: Secondary | ICD-10-CM | POA: Diagnosis not present

## 2023-06-13 DIAGNOSIS — F4323 Adjustment disorder with mixed anxiety and depressed mood: Secondary | ICD-10-CM | POA: Diagnosis not present

## 2023-06-16 DIAGNOSIS — F4323 Adjustment disorder with mixed anxiety and depressed mood: Secondary | ICD-10-CM | POA: Diagnosis not present

## 2023-06-20 DIAGNOSIS — F4323 Adjustment disorder with mixed anxiety and depressed mood: Secondary | ICD-10-CM | POA: Diagnosis not present

## 2023-06-23 DIAGNOSIS — F4323 Adjustment disorder with mixed anxiety and depressed mood: Secondary | ICD-10-CM | POA: Diagnosis not present

## 2023-06-24 DIAGNOSIS — E889 Metabolic disorder, unspecified: Secondary | ICD-10-CM | POA: Diagnosis not present

## 2023-06-27 DIAGNOSIS — F4323 Adjustment disorder with mixed anxiety and depressed mood: Secondary | ICD-10-CM | POA: Diagnosis not present

## 2023-06-30 DIAGNOSIS — F4323 Adjustment disorder with mixed anxiety and depressed mood: Secondary | ICD-10-CM | POA: Diagnosis not present

## 2023-07-04 DIAGNOSIS — F4323 Adjustment disorder with mixed anxiety and depressed mood: Secondary | ICD-10-CM | POA: Diagnosis not present

## 2023-07-07 DIAGNOSIS — F4323 Adjustment disorder with mixed anxiety and depressed mood: Secondary | ICD-10-CM | POA: Diagnosis not present

## 2023-07-11 DIAGNOSIS — F4323 Adjustment disorder with mixed anxiety and depressed mood: Secondary | ICD-10-CM | POA: Diagnosis not present

## 2023-07-13 DIAGNOSIS — F900 Attention-deficit hyperactivity disorder, predominantly inattentive type: Secondary | ICD-10-CM | POA: Diagnosis not present

## 2023-07-18 DIAGNOSIS — F4323 Adjustment disorder with mixed anxiety and depressed mood: Secondary | ICD-10-CM | POA: Diagnosis not present

## 2023-07-21 DIAGNOSIS — D1721 Benign lipomatous neoplasm of skin and subcutaneous tissue of right arm: Secondary | ICD-10-CM | POA: Diagnosis not present

## 2023-07-21 DIAGNOSIS — F4323 Adjustment disorder with mixed anxiety and depressed mood: Secondary | ICD-10-CM | POA: Diagnosis not present

## 2023-07-25 DIAGNOSIS — F4323 Adjustment disorder with mixed anxiety and depressed mood: Secondary | ICD-10-CM | POA: Diagnosis not present

## 2023-07-28 DIAGNOSIS — F4323 Adjustment disorder with mixed anxiety and depressed mood: Secondary | ICD-10-CM | POA: Diagnosis not present

## 2023-08-01 DIAGNOSIS — F4323 Adjustment disorder with mixed anxiety and depressed mood: Secondary | ICD-10-CM | POA: Diagnosis not present

## 2023-08-05 DIAGNOSIS — F4323 Adjustment disorder with mixed anxiety and depressed mood: Secondary | ICD-10-CM | POA: Diagnosis not present

## 2023-08-08 DIAGNOSIS — F4323 Adjustment disorder with mixed anxiety and depressed mood: Secondary | ICD-10-CM | POA: Diagnosis not present

## 2023-08-09 DIAGNOSIS — D1721 Benign lipomatous neoplasm of skin and subcutaneous tissue of right arm: Secondary | ICD-10-CM | POA: Diagnosis not present

## 2023-08-12 DIAGNOSIS — F4323 Adjustment disorder with mixed anxiety and depressed mood: Secondary | ICD-10-CM | POA: Diagnosis not present

## 2023-08-15 DIAGNOSIS — F4323 Adjustment disorder with mixed anxiety and depressed mood: Secondary | ICD-10-CM | POA: Diagnosis not present

## 2023-08-18 DIAGNOSIS — F4323 Adjustment disorder with mixed anxiety and depressed mood: Secondary | ICD-10-CM | POA: Diagnosis not present

## 2023-08-22 DIAGNOSIS — F4323 Adjustment disorder with mixed anxiety and depressed mood: Secondary | ICD-10-CM | POA: Diagnosis not present

## 2023-08-25 DIAGNOSIS — F4323 Adjustment disorder with mixed anxiety and depressed mood: Secondary | ICD-10-CM | POA: Diagnosis not present

## 2023-08-29 DIAGNOSIS — F4323 Adjustment disorder with mixed anxiety and depressed mood: Secondary | ICD-10-CM | POA: Diagnosis not present

## 2023-09-01 DIAGNOSIS — F4323 Adjustment disorder with mixed anxiety and depressed mood: Secondary | ICD-10-CM | POA: Diagnosis not present

## 2023-09-05 DIAGNOSIS — F4323 Adjustment disorder with mixed anxiety and depressed mood: Secondary | ICD-10-CM | POA: Diagnosis not present

## 2023-09-08 DIAGNOSIS — F900 Attention-deficit hyperactivity disorder, predominantly inattentive type: Secondary | ICD-10-CM | POA: Diagnosis not present

## 2023-09-08 DIAGNOSIS — F4323 Adjustment disorder with mixed anxiety and depressed mood: Secondary | ICD-10-CM | POA: Diagnosis not present

## 2023-09-12 DIAGNOSIS — F4323 Adjustment disorder with mixed anxiety and depressed mood: Secondary | ICD-10-CM | POA: Diagnosis not present

## 2023-09-15 DIAGNOSIS — F4323 Adjustment disorder with mixed anxiety and depressed mood: Secondary | ICD-10-CM | POA: Diagnosis not present

## 2023-09-19 DIAGNOSIS — F4323 Adjustment disorder with mixed anxiety and depressed mood: Secondary | ICD-10-CM | POA: Diagnosis not present

## 2023-09-22 DIAGNOSIS — F4323 Adjustment disorder with mixed anxiety and depressed mood: Secondary | ICD-10-CM | POA: Diagnosis not present

## 2023-09-26 DIAGNOSIS — F4323 Adjustment disorder with mixed anxiety and depressed mood: Secondary | ICD-10-CM | POA: Diagnosis not present

## 2023-09-29 DIAGNOSIS — F4323 Adjustment disorder with mixed anxiety and depressed mood: Secondary | ICD-10-CM | POA: Diagnosis not present

## 2023-10-03 DIAGNOSIS — F4323 Adjustment disorder with mixed anxiety and depressed mood: Secondary | ICD-10-CM | POA: Diagnosis not present

## 2023-10-06 DIAGNOSIS — F4323 Adjustment disorder with mixed anxiety and depressed mood: Secondary | ICD-10-CM | POA: Diagnosis not present

## 2023-10-10 DIAGNOSIS — F4323 Adjustment disorder with mixed anxiety and depressed mood: Secondary | ICD-10-CM | POA: Diagnosis not present

## 2023-10-13 DIAGNOSIS — F4323 Adjustment disorder with mixed anxiety and depressed mood: Secondary | ICD-10-CM | POA: Diagnosis not present

## 2023-10-17 DIAGNOSIS — F4323 Adjustment disorder with mixed anxiety and depressed mood: Secondary | ICD-10-CM | POA: Diagnosis not present

## 2023-10-20 DIAGNOSIS — F4323 Adjustment disorder with mixed anxiety and depressed mood: Secondary | ICD-10-CM | POA: Diagnosis not present

## 2023-10-24 DIAGNOSIS — F4323 Adjustment disorder with mixed anxiety and depressed mood: Secondary | ICD-10-CM | POA: Diagnosis not present

## 2023-10-31 DIAGNOSIS — F4323 Adjustment disorder with mixed anxiety and depressed mood: Secondary | ICD-10-CM | POA: Diagnosis not present

## 2023-11-03 DIAGNOSIS — F4323 Adjustment disorder with mixed anxiety and depressed mood: Secondary | ICD-10-CM | POA: Diagnosis not present

## 2023-11-07 DIAGNOSIS — F4323 Adjustment disorder with mixed anxiety and depressed mood: Secondary | ICD-10-CM | POA: Diagnosis not present

## 2023-11-11 DIAGNOSIS — F4323 Adjustment disorder with mixed anxiety and depressed mood: Secondary | ICD-10-CM | POA: Diagnosis not present

## 2023-11-14 DIAGNOSIS — F4323 Adjustment disorder with mixed anxiety and depressed mood: Secondary | ICD-10-CM | POA: Diagnosis not present

## 2023-11-21 DIAGNOSIS — F4323 Adjustment disorder with mixed anxiety and depressed mood: Secondary | ICD-10-CM | POA: Diagnosis not present

## 2023-11-28 DIAGNOSIS — F4323 Adjustment disorder with mixed anxiety and depressed mood: Secondary | ICD-10-CM | POA: Diagnosis not present

## 2023-12-02 DIAGNOSIS — F4323 Adjustment disorder with mixed anxiety and depressed mood: Secondary | ICD-10-CM | POA: Diagnosis not present

## 2023-12-05 DIAGNOSIS — F4323 Adjustment disorder with mixed anxiety and depressed mood: Secondary | ICD-10-CM | POA: Diagnosis not present

## 2023-12-07 DIAGNOSIS — F900 Attention-deficit hyperactivity disorder, predominantly inattentive type: Secondary | ICD-10-CM | POA: Diagnosis not present

## 2023-12-08 DIAGNOSIS — Z0000 Encounter for general adult medical examination without abnormal findings: Secondary | ICD-10-CM | POA: Diagnosis not present

## 2023-12-08 DIAGNOSIS — M25562 Pain in left knee: Secondary | ICD-10-CM | POA: Diagnosis not present

## 2023-12-08 DIAGNOSIS — N920 Excessive and frequent menstruation with regular cycle: Secondary | ICD-10-CM | POA: Diagnosis not present

## 2023-12-08 DIAGNOSIS — M25462 Effusion, left knee: Secondary | ICD-10-CM | POA: Diagnosis not present

## 2023-12-08 DIAGNOSIS — M2550 Pain in unspecified joint: Secondary | ICD-10-CM | POA: Diagnosis not present

## 2023-12-08 DIAGNOSIS — E039 Hypothyroidism, unspecified: Secondary | ICD-10-CM | POA: Diagnosis not present

## 2023-12-08 DIAGNOSIS — R5383 Other fatigue: Secondary | ICD-10-CM | POA: Diagnosis not present

## 2023-12-08 DIAGNOSIS — G8929 Other chronic pain: Secondary | ICD-10-CM | POA: Diagnosis not present

## 2023-12-08 DIAGNOSIS — Z23 Encounter for immunization: Secondary | ICD-10-CM | POA: Diagnosis not present

## 2023-12-08 DIAGNOSIS — I471 Supraventricular tachycardia, unspecified: Secondary | ICD-10-CM | POA: Diagnosis not present

## 2023-12-08 DIAGNOSIS — M25561 Pain in right knee: Secondary | ICD-10-CM | POA: Diagnosis not present

## 2023-12-12 DIAGNOSIS — F4323 Adjustment disorder with mixed anxiety and depressed mood: Secondary | ICD-10-CM | POA: Diagnosis not present

## 2023-12-16 DIAGNOSIS — F4323 Adjustment disorder with mixed anxiety and depressed mood: Secondary | ICD-10-CM | POA: Diagnosis not present

## 2024-02-17 ENCOUNTER — Other Ambulatory Visit: Payer: Self-pay | Admitting: *Deleted

## 2024-02-17 DIAGNOSIS — E063 Autoimmune thyroiditis: Secondary | ICD-10-CM

## 2024-02-27 ENCOUNTER — Other Ambulatory Visit
